# Patient Record
Sex: Male | Born: 1955 | Race: White | Hispanic: No | Marital: Married | Smoking: Former smoker
Health system: Southern US, Community
[De-identification: ages and names within clinical notes are randomized; demographics above are authoritative.]

## PROBLEM LIST (undated history)

## (undated) DIAGNOSIS — J449 Chronic obstructive pulmonary disease, unspecified: Secondary | ICD-10-CM

## (undated) DIAGNOSIS — N200 Calculus of kidney: Secondary | ICD-10-CM

## (undated) DIAGNOSIS — G4733 Obstructive sleep apnea (adult) (pediatric): Secondary | ICD-10-CM

## (undated) DIAGNOSIS — I10 Essential (primary) hypertension: Secondary | ICD-10-CM

## (undated) DIAGNOSIS — G4730 Sleep apnea, unspecified: Secondary | ICD-10-CM

## (undated) DIAGNOSIS — R29898 Other symptoms and signs involving the musculoskeletal system: Secondary | ICD-10-CM

## (undated) DIAGNOSIS — G8929 Other chronic pain: Secondary | ICD-10-CM

## (undated) DIAGNOSIS — M545 Low back pain, unspecified: Secondary | ICD-10-CM

## (undated) DIAGNOSIS — Z9989 Dependence on other enabling machines and devices: Secondary | ICD-10-CM

## (undated) DIAGNOSIS — R296 Repeated falls: Secondary | ICD-10-CM

## (undated) HISTORY — PX: LUMBAR DISC SURGERY: SHX700

## (undated) HISTORY — PX: LITHOTRIPSY: SUR834

## (undated) HISTORY — DX: Sleep apnea, unspecified: G47.30

## (undated) HISTORY — PX: APPENDECTOMY: SHX54

## (undated) HISTORY — DX: Essential (primary) hypertension: I10

## (undated) HISTORY — PX: HERNIA REPAIR: SHX51

---

## 2012-04-15 HISTORY — PX: ABDOMINAL HERNIA REPAIR: SHX539

## 2012-04-15 HISTORY — PX: TRANSURETHRAL RESECTION OF PROSTATE: SHX73

## 2013-04-15 HISTORY — PX: COLONOSCOPY: SHX174

## 2013-05-05 ENCOUNTER — Ambulatory Visit: Payer: Self-pay | Admitting: Cardiovascular Disease

## 2013-06-25 ENCOUNTER — Ambulatory Visit: Payer: Self-pay | Admitting: Cardiovascular Disease

## 2015-02-09 DIAGNOSIS — J449 Chronic obstructive pulmonary disease, unspecified: Secondary | ICD-10-CM | POA: Insufficient documentation

## 2015-02-09 DIAGNOSIS — Z87442 Personal history of urinary calculi: Secondary | ICD-10-CM | POA: Insufficient documentation

## 2015-02-09 DIAGNOSIS — M545 Low back pain: Secondary | ICD-10-CM | POA: Insufficient documentation

## 2015-02-09 DIAGNOSIS — G8929 Other chronic pain: Secondary | ICD-10-CM | POA: Insufficient documentation

## 2015-02-10 ENCOUNTER — Encounter (HOSPITAL_COMMUNITY): Payer: Self-pay | Admitting: Emergency Medicine

## 2015-02-10 ENCOUNTER — Emergency Department (HOSPITAL_COMMUNITY)
Admission: EM | Admit: 2015-02-10 | Discharge: 2015-02-10 | Disposition: A | Discharge status: Home / Self Care | Payer: 59 | Attending: Emergency Medicine | Admitting: Emergency Medicine

## 2015-02-10 DIAGNOSIS — M549 Dorsalgia, unspecified: Secondary | ICD-10-CM

## 2015-02-10 DIAGNOSIS — G8929 Other chronic pain: Secondary | ICD-10-CM

## 2015-02-10 HISTORY — DX: Chronic obstructive pulmonary disease, unspecified: J44.9

## 2015-02-10 HISTORY — DX: Calculus of kidney: N20.0

## 2015-02-10 MED ORDER — METHOCARBAMOL 1000 MG/10ML IJ SOLN: 1000.0000 mg | Freq: Once | INTRAMUSCULAR | Status: DC

## 2015-02-10 MED ORDER — ORPHENADRINE CITRATE ER 100 MG PO TB12: 100.0000 mg | ORAL_TABLET | Freq: Two times a day (BID) | ORAL | Status: DC

## 2015-02-10 MED ORDER — KETOROLAC TROMETHAMINE 30 MG/ML IJ SOLN
30.0000 mg | Freq: Once | INTRAMUSCULAR | Status: AC
  Administered 2015-02-10: 30 mg via INTRAVENOUS
  Filled 2015-02-10: qty 1

## 2015-02-10 MED ORDER — METHOCARBAMOL 1000 MG/10ML IJ SOLN
1000.0000 mg | Freq: Once | INTRAMUSCULAR | Status: AC
  Administered 2015-02-10: 1000 mg via INTRAVENOUS
  Filled 2015-02-10: qty 10

## 2015-02-10 MED ORDER — ONDANSETRON HCL 4 MG/2ML IJ SOLN
4.0000 mg | Freq: Once | INTRAMUSCULAR | Status: AC
  Administered 2015-02-10: 4 mg via INTRAVENOUS
  Filled 2015-02-10: qty 2

## 2015-02-10 MED ORDER — HYDROMORPHONE HCL 1 MG/ML IJ SOLN
1.0000 mg | Freq: Once | INTRAMUSCULAR | Status: AC
  Administered 2015-02-10: 1 mg via INTRAVENOUS
  Filled 2015-02-10: qty 1

## 2015-02-10 NOTE — ED Provider Notes (Signed)
CSN: [id number]     Arrival date & time 02/09/15  2349 History   By signing my name below, I, Arlan Organshley Leger, attest that this documentation has been prepared under the direction and in the presence of Dione Boozeavid Rebekka Lobello, MD.  Electronically Signed: Arlan OrganAshley Leger, ED Scribe. 02/10/2015. 2:09 AM.   Chief Complaint  Patient presents with  . Back Pain   The history is provided by the patient. No language interpreter was used.    HPI Comments: Jerel ShepherdJames Hodder is a 59 y.o. male who presents to the Emergency Department complaining of constant, ongoing R lower back pain that radiates down the R lower extremity that has been chronic in nature for several months now. However, pain worsened this evening. No identified injury or trauma. Pain is made worse with certain movements. No alleviating factors at this time. Prescribed Oxycodone attempted at home without any improvement for discomfort. No recent fever, chills, nausea, vomiting, chest pain, or shortness of breath. Denies weakness, loss of bowel/bladder function or saddle anesthesia.  PCP: No primary care provider on file.     Past Medical History  Diagnosis Date  . COPD (chronic obstructive pulmonary disease) (HCC)   . Kidney stone    Past Surgical History  Procedure Laterality Date  . Appendectomy    . Transurethral resection of prostate     No family history on file. Social History  Substance Use Topics  . Smoking status: Former Games developermoker  . Smokeless tobacco: None  . Alcohol Use: No    Review of Systems  Constitutional: Negative for fever and chills.  Respiratory: Negative for cough and shortness of breath.   Cardiovascular: Negative for chest pain.  Gastrointestinal: Negative for nausea, vomiting, abdominal pain and diarrhea.  Genitourinary: Negative for difficulty urinating.  Musculoskeletal: Positive for back pain and arthralgias.  Skin: Negative for rash.  Neurological: Negative for headaches.  Psychiatric/Behavioral: Negative  for confusion.  All other systems reviewed and are negative.     Allergies  Review of patient's allergies indicates no known allergies.  Home Medications   Prior to Admission medications   Not on File   Triage Vitals: BP 129/69 mmHg  Pulse 88  Temp(Src) 98.8 F (37.1 C) (Oral)  Resp 18  SpO2 92%  Physical Exam  Constitutional: He is oriented to person, place, and time. He appears well-developed and well-nourished.  Moves with trepidation splinting his lower back   HENT:  Head: Normocephalic and atraumatic.  Eyes: EOM are normal.  Neck: Normal range of motion.  Cardiovascular: Normal rate, regular rhythm, normal heart sounds and intact distal pulses.   Pulmonary/Chest: Effort normal and breath sounds normal. No respiratory distress.  Abdominal: Soft. He exhibits no distension. There is no tenderness.  Musculoskeletal: Normal range of motion. He exhibits tenderness.  Mild tenderness to R posterior iliac area Straight leg raise positive on R at 45 degrees   Neurological: He is alert and oriented to person, place, and time.  Skin: Skin is warm and dry.  Psychiatric: He has a normal mood and affect. Judgment normal.  Nursing note and vitals reviewed.   ED Course  Procedures (including critical care time)  DIAGNOSTIC STUDIES: Oxygen Saturation is 92% on ra, adequate by my interpretation.    COORDINATION OF CARE: 2:02 AM-Discussed treatment plan with pt at bedside and pt agreed to plan.     MDM   Final diagnoses:  Exacerbation of chronic back pain    Exacerbation of chronic back pain. No evidence of neurologic injury.  He was given intravenous ketorolac, methocarbamol, hydromorphone and felt considerably better following this. He is already on NSAIDs and narcotics at home. He is given a prescription for orphenadrine to add to his regimen and he is to follow-up with his orthopedic surgeon.  I personally performed the services described in this documentation, which was  scribed in my presence. The recorded information has been reviewed and is accurate.      Dione Booze, MD 02/10/15 [phone]

## 2015-02-10 NOTE — ED Notes (Signed)
Dr. Glick at bedside at this time  

## 2015-02-10 NOTE — Discharge Instructions (Signed)
Continue taking her meloxicam and oxycodone-acetaminophen. Follow up with your orthopedic doctor. Return if pain is not being adequately controlled at home.  Back Pain, Adult Back pain is very common in adults.The cause of back pain is rarely dangerous and the pain often gets better over time.The cause of your back pain may not be known. Some common causes of back pain include:  Strain of the muscles or ligaments supporting the spine.  Wear and tear (degeneration) of the spinal disks.  Arthritis.  Direct injury to the back. For many people, back pain may return. Since back pain is rarely dangerous, most people can learn to manage this condition on their own. HOME CARE INSTRUCTIONS Watch your back pain for any changes. The following actions may help to lessen any discomfort you are feeling:  Remain active. It is stressful on your back to sit or stand in one place for long periods of time. Do not sit, drive, or stand in one place for more than 30 minutes at a time. Take short walks on even surfaces as soon as you are able.Try to increase the length of time you walk each day.  Exercise regularly as directed by your health care provider. Exercise helps your back heal faster. It also helps avoid future injury by keeping your muscles strong and flexible.  Do not stay in bed.Resting more than 1-2 days can delay your recovery.  Pay attention to your body when you bend and lift. The most comfortable positions are those that put less stress on your recovering back. Always use proper lifting techniques, including:  Bending your knees.  Keeping the load close to your body.  Avoiding twisting.  Find a comfortable position to sleep. Use a firm mattress and lie on your side with your knees slightly bent. If you lie on your back, put a pillow under your knees.  Avoid feeling anxious or stressed.Stress increases muscle tension and can worsen back pain.It is important to recognize when you are  anxious or stressed and learn ways to manage it, such as with exercise.  Take medicines only as directed by your health care provider. Over-the-counter medicines to reduce pain and inflammation are often the most helpful.Your health care provider may prescribe muscle relaxant drugs.These medicines help dull your pain so you can more quickly return to your normal activities and healthy exercise.  Apply ice to the injured area:  Put ice in a plastic bag.  Place a towel between your skin and the bag.  Leave the ice on for 20 minutes, 2-3 times a day for the first 2-3 days. After that, ice and heat may be alternated to reduce pain and spasms.  Maintain a healthy weight. Excess weight puts extra stress on your back and makes it difficult to maintain good posture. SEEK MEDICAL CARE IF:  You have pain that is not relieved with rest or medicine.  You have increasing pain going down into the legs or buttocks.  You have pain that does not improve in one week.  You have night pain.  You lose weight.  You have a fever or chills. SEEK IMMEDIATE MEDICAL CARE IF:   You develop new bowel or bladder control problems.  You have unusual weakness or numbness in your arms or legs.  You develop nausea or vomiting.  You develop abdominal pain.  You feel faint.   This information is not intended to replace advice given to you by your health care provider. Make sure you discuss any questions you  have with your health care provider.   Document Released: 04/01/2005 Document Revised: 04/22/2014 Document Reviewed: 08/03/2013 Elsevier Interactive Patient Education 2016 Elsevier Inc.  Orphenadrine tablets What is this medicine? ORPHENADRINE (or FEN a dreen) helps to relieve pain and stiffness in muscles and can treat muscle spasms. This medicine may be used for other purposes; ask your health care provider or pharmacist if you have questions. What should I tell my health care provider before I  take this medicine? They need to know if you have any of these conditions: -glaucoma -heart disease -kidney disease -myasthenia gravis -peptic ulcer disease -prostate disease -stomach problems -an unusual or allergic reaction to orphenadrine, other medicines, foods, lactose, dyes, or preservatives -pregnant or trying to get pregnant -breast-feeding How should I use this medicine? Take this medicine by mouth with a full glass of water. Follow the directions on the prescription label. Take your medicine at regular intervals. Do not take your medicine more often than directed. Do not take more than you are told to take. Talk to your pediatrician regarding the use of this medicine in children. Special care may be needed. Patients over 58 years old may have a stronger reaction and need a smaller dose. Overdosage: If you think you have taken too much of this medicine contact a poison control center or emergency room at once. NOTE: This medicine is only for you. Do not share this medicine with others. What if I miss a dose? If you miss a dose, take it as soon as you can. If it is almost time for your next dose, take only that dose. Do not take double or extra doses. What may interact with this medicine? -alcohol -antihistamines -barbiturates, like phenobarbital -benzodiazepines -cyclobenzaprine -medicines for pain -phenothiazines like chlorpromazine, mesoridazine, prochlorperazine, thioridazine This list may not describe all possible interactions. Give your health care provider a list of all the medicines, herbs, non-prescription drugs, or dietary supplements you use. Also tell them if you smoke, drink alcohol, or use illegal drugs. Some items may interact with your medicine. What should I watch for while using this medicine? Your mouth may get dry. Chewing sugarless gum or sucking hard candy, and drinking plenty of water may help. Contact your doctor if the problem does not go away or is  severe. This medicine may cause dry eyes and blurred vision. If you wear contact lenses you may feel some discomfort. Lubricating drops may help. See your eye doctor if the problem does not go away or is severe. You may get drowsy or dizzy. Do not drive, use machinery, or do anything that needs mental alertness until you know how this medicine affects you. Do not stand or sit up quickly, especially if you are an older patient. This reduces the risk of dizzy or fainting spells. Alcohol may interfere with the effect of this medicine. Avoid alcoholic drinks. What side effects may I notice from receiving this medicine? Side effects that you should report to your doctor or health care professional as soon as possible: -allergic reactions like skin rash, itching or hives, swelling of the face, lips, or tongue -changes in vision -difficulty breathing -fast heartbeat or palpitations -hallucinations -light headedness, fainting spells -vomiting Side effects that usually do not require medical attention (report to your doctor or health care professional if they continue or are bothersome): -dizziness -drowsiness -headache -nausea This list may not describe all possible side effects. Call your doctor for medical advice about side effects. You may report side effects to FDA  at 1-800-FDA-1088. Where should I keep my medicine? Keep out of the reach of children. This medicine may cause accidental overdose and death if it taken by other adults, children, or pets. Mix any unused medicine with a substance like cat litter or coffee grounds. Then throw the medicine away in a sealed container like a sealed bag or a coffee can with a lid. Do not use the medicine after the expiration date. Store at room temperature between 15 and 30 degrees C (59 and 86 degrees F). NOTE: This sheet is a summary. It may not cover all possible information. If you have questions about this medicine, talk to your doctor, pharmacist, or  health care provider.    2016, Elsevier/Gold Standard. (2013-05-28 15:35:08)

## 2015-02-10 NOTE — ED Notes (Signed)
Discharge instructions/prescription reviewed with patient/spouse. Understanding verbalized. Patient declined wheelchair at time of discharge.  

## 2015-02-10 NOTE — ED Notes (Signed)
Pt. reports chronic low back pain for 2 months worse today unrelieved by prescription Oxycodone , pt. is scheduled for lumbar back surgery on 03/08/2015 by Dr. Debbe BalesBrook . Denies injury / no urinary discomfort .

## 2015-02-15 ENCOUNTER — Inpatient Hospital Stay (HOSPITAL_COMMUNITY)
Admission: EM | Admit: 2015-02-15 | Discharge: 2015-02-18 | DRG: 520 | Disposition: A | Discharge status: Home Health | Payer: 59 | Attending: Orthopedic Surgery | Admitting: Orthopedic Surgery

## 2015-02-15 ENCOUNTER — Encounter (HOSPITAL_COMMUNITY): Payer: Self-pay | Admitting: *Deleted

## 2015-02-15 ENCOUNTER — Inpatient Hospital Stay (HOSPITAL_COMMUNITY): Payer: 59

## 2015-02-15 DIAGNOSIS — M4806 Spinal stenosis, lumbar region: Secondary | ICD-10-CM | POA: Diagnosis present

## 2015-02-15 DIAGNOSIS — Z419 Encounter for procedure for purposes other than remedying health state, unspecified: Secondary | ICD-10-CM

## 2015-02-15 DIAGNOSIS — J449 Chronic obstructive pulmonary disease, unspecified: Secondary | ICD-10-CM | POA: Diagnosis present

## 2015-02-15 DIAGNOSIS — M5136 Other intervertebral disc degeneration, lumbar region: Secondary | ICD-10-CM | POA: Diagnosis present

## 2015-02-15 DIAGNOSIS — G4733 Obstructive sleep apnea (adult) (pediatric): Secondary | ICD-10-CM | POA: Diagnosis present

## 2015-02-15 DIAGNOSIS — Z9079 Acquired absence of other genital organ(s): Secondary | ICD-10-CM

## 2015-02-15 DIAGNOSIS — Z87891 Personal history of nicotine dependence: Secondary | ICD-10-CM

## 2015-02-15 DIAGNOSIS — M5127 Other intervertebral disc displacement, lumbosacral region: Secondary | ICD-10-CM

## 2015-02-15 DIAGNOSIS — K5900 Constipation, unspecified: Secondary | ICD-10-CM | POA: Diagnosis present

## 2015-02-15 DIAGNOSIS — M5416 Radiculopathy, lumbar region: Secondary | ICD-10-CM | POA: Diagnosis present

## 2015-02-15 DIAGNOSIS — R296 Repeated falls: Secondary | ICD-10-CM | POA: Diagnosis present

## 2015-02-15 DIAGNOSIS — M549 Dorsalgia, unspecified: Secondary | ICD-10-CM | POA: Diagnosis present

## 2015-02-15 DIAGNOSIS — M5116 Intervertebral disc disorders with radiculopathy, lumbar region: Secondary | ICD-10-CM | POA: Diagnosis present

## 2015-02-15 HISTORY — DX: Obstructive sleep apnea (adult) (pediatric): G47.33

## 2015-02-15 HISTORY — DX: Dependence on other enabling machines and devices: Z99.89

## 2015-02-15 HISTORY — DX: Other chronic pain: G89.29

## 2015-02-15 HISTORY — DX: Low back pain: M54.5

## 2015-02-15 HISTORY — DX: Other symptoms and signs involving the musculoskeletal system: R29.898

## 2015-02-15 HISTORY — DX: Low back pain, unspecified: M54.50

## 2015-02-15 HISTORY — DX: Repeated falls: R29.6

## 2015-02-15 LAB — SURGICAL PCR SCREEN
MRSA, PCR: NEGATIVE
Staphylococcus aureus: POSITIVE — AB

## 2015-02-15 MED ORDER — ACETAMINOPHEN 325 MG PO TABS: 650.0000 mg | ORAL_TABLET | Freq: Four times a day (QID) | ORAL | Status: DC | PRN

## 2015-02-15 MED ORDER — DOCUSATE SODIUM 100 MG PO CAPS
100.0000 mg | ORAL_CAPSULE | Freq: Two times a day (BID) | ORAL | Status: DC
  Administered 2015-02-15: 100 mg via ORAL
  Filled 2015-02-15: qty 1

## 2015-02-15 MED ORDER — MOMETASONE FURO-FORMOTEROL FUM 200-5 MCG/ACT IN AERO
2.0000 | INHALATION_SPRAY | Freq: Two times a day (BID) | RESPIRATORY_TRACT | Status: DC
  Administered 2015-02-15 – 2015-02-16 (×2): 2 via RESPIRATORY_TRACT
  Filled 2015-02-15: qty 8.8

## 2015-02-15 MED ORDER — ORPHENADRINE CITRATE ER 100 MG PO TB12
100.0000 mg | ORAL_TABLET | Freq: Two times a day (BID) | ORAL | Status: DC
  Filled 2015-02-15 (×3): qty 1

## 2015-02-15 MED ORDER — ONDANSETRON HCL 4 MG PO TABS: 4.0000 mg | ORAL_TABLET | Freq: Four times a day (QID) | ORAL | Status: DC | PRN

## 2015-02-15 MED ORDER — ZOLPIDEM TARTRATE 5 MG PO TABS
10.0000 mg | ORAL_TABLET | Freq: Every evening | ORAL | Status: DC | PRN
  Administered 2015-02-16: 10 mg via ORAL
  Filled 2015-02-15: qty 2

## 2015-02-15 MED ORDER — HYDROMORPHONE HCL 1 MG/ML IJ SOLN
1.0000 mg | INTRAMUSCULAR | Status: DC | PRN
  Administered 2015-02-15 – 2015-02-16 (×5): 1 mg via INTRAVENOUS
  Filled 2015-02-15 (×5): qty 1

## 2015-02-15 MED ORDER — MORPHINE SULFATE (PF) 2 MG/ML IV SOLN
2.0000 mg | INTRAVENOUS | Status: DC | PRN
  Administered 2015-02-15: 2 mg via INTRAVENOUS
  Filled 2015-02-15: qty 1

## 2015-02-15 MED ORDER — OXYCODONE HCL 5 MG PO TABS
5.0000 mg | ORAL_TABLET | ORAL | Status: DC | PRN
  Administered 2015-02-15 – 2015-02-16 (×2): 5 mg via ORAL
  Filled 2015-02-15 (×2): qty 1

## 2015-02-15 MED ORDER — ONDANSETRON HCL 4 MG/2ML IJ SOLN: 4.0000 mg | Freq: Four times a day (QID) | INTRAMUSCULAR | Status: DC | PRN

## 2015-02-15 MED ORDER — POLYETHYLENE GLYCOL 3350 17 G PO PACK: 17.0000 g | PACK | Freq: Every day | ORAL | Status: DC | PRN

## 2015-02-15 MED ORDER — HYDROMORPHONE HCL 1 MG/ML IJ SOLN
1.0000 mg | INTRAMUSCULAR | Status: DC | PRN
  Administered 2015-02-15: 1 mg via INTRAVENOUS
  Filled 2015-02-15: qty 1

## 2015-02-15 MED ORDER — FLEET ENEMA 7-19 GM/118ML RE ENEM: 1.0000 | ENEMA | Freq: Once | RECTAL | Status: DC | PRN

## 2015-02-15 MED ORDER — LORAZEPAM 2 MG/ML IJ SOLN
INTRAMUSCULAR | Status: AC
  Filled 2015-02-15: qty 1

## 2015-02-15 MED ORDER — ALPRAZOLAM 0.5 MG PO TABS
1.0000 mg | ORAL_TABLET | Freq: Every evening | ORAL | Status: DC | PRN
  Administered 2015-02-15: 1 mg via ORAL
  Filled 2015-02-15: qty 2

## 2015-02-15 MED ORDER — ALUM & MAG HYDROXIDE-SIMETH 200-200-20 MG/5ML PO SUSP: 30.0000 mL | Freq: Four times a day (QID) | ORAL | Status: DC | PRN

## 2015-02-15 MED ORDER — ACETAMINOPHEN 650 MG RE SUPP: 650.0000 mg | Freq: Four times a day (QID) | RECTAL | Status: DC | PRN

## 2015-02-15 MED ORDER — INFLUENZA VAC SPLIT QUAD 0.5 ML IM SUSY: 0.5000 mL | PREFILLED_SYRINGE | INTRAMUSCULAR | Status: DC

## 2015-02-15 MED ORDER — LORAZEPAM 2 MG/ML IJ SOLN
1.0000 mg | Freq: Once | INTRAMUSCULAR | Status: AC
  Administered 2015-02-15: 1 mg via INTRAVENOUS

## 2015-02-15 NOTE — ED Provider Notes (Signed)
MSE was initiated and I personally evaluated the patient and placed orders (if any) at  12:29 PM on February 15, 2015.  Kristopher ShepherdJames Walsh is a 59 y.o. male with a history of a herniated disc and low back pain who presents to the emergency department after the direction of orthopedic surgeon Dr. Shon BatonBrooks for admission and MRI. Patient patient reports he's been having worsening low back pain for the past 2 months. He reports he has numbness and tingling from his bilateral feet to his knees for the past week. He reports he's been having several falls recently due to tripping over his feet. He reports he last fell yesterday and denies any head or loss of consciousness. He denies recent injury to his back. On exam the patient is afebrile nontoxic appearing. He has midline low back and bilateral low back tenderness to palpation. Back edema to, deformity, ecchymosis or erythema. Patient has weakness with right-sided dorsiflexion. Plantar flexion bilaterally is intact. He reports decreased sensation to his bilateral lower extremities. Anette Riedelarmen Mayo, PA-C from Dr. Shon BatonBrooks office is in the ED and placed MRI of lumbar spine and admission orders. Patient will be admitted to Dr. Shon BatonBrooks service and will obtain MRI of his lumbar spine.   Filed Vitals:   02/15/15 1126 02/15/15 1145 02/15/15 1215  BP: 135/85 141/76 115/77  Pulse: 88 85 79  Temp: 98.5 F (36.9 C)    TempSrc: Oral    Resp: 18    SpO2: 93% 94% 93%      Everlene FarrierWilliam Rohaan Durnil, PA-C 02/15/15 1232  Benjiman CoreNathan Pickering, MD 02/15/15 1636

## 2015-02-15 NOTE — ED Notes (Signed)
Pt remains monitored by blood pressure and pulse ox. pts family remains at bedside.  

## 2015-02-15 NOTE — Progress Notes (Signed)
Patient has his own home CPAP unit and states that he will place it on when he is ready to go to sleep for the night. RT filled with sterile water and will continue to monitor.

## 2015-02-15 NOTE — Consult Note (Addendum)
    Patient ID: Kristopher Walsh MRN: [id number] DOB/AGE: 1955/10/16 59 y.o.  Admit date: 02/15/2015  Admission Diagnoses:  Active Problems:   * No active hospital problems. *   HPI: 59 year old male patient present with acute on chronic LBP and increasing symptom of right leg pain and weakness.  The pt has been falling and is concerned about further injury.  He reports even with pain medication his pain is severe.  He reports deterioration to having to use a walker because his right leg will give out.    Past Medical History: Past Medical History  Diagnosis Date  . COPD (chronic obstructive pulmonary disease) (HCC)   . Kidney stone     Surgical History: Past Surgical History  Procedure Laterality Date  . Appendectomy    . Transurethral resection of prostate      Family History: No family history on file.  Social History: Social History   Social History  . Marital Status: Married    Spouse Name: N/A  . Number of Children: N/A  . Years of Education: N/A   Occupational History  . Not on file.   Social History Main Topics  . Smoking status: Former Games developermoker  . Smokeless tobacco: Not on file  . Alcohol Use: No  . Drug Use: No  . Sexual Activity: Not on file   Other Topics Concern  . Not on file   Social History Narrative    Allergies: Review of patient's allergies indicates no known allergies.  Medications: I have reviewed the patient's current medications.  Vital Signs: Patient Vitals for the past 24 hrs:  BP Temp Temp src Pulse Resp SpO2  02/15/15 1126 135/85 mmHg 98.5 F (36.9 C) Oral 88 18 93 %    Radiology: No results found.  Labs: No results for input(s): WBC, RBC, HCT, PLT in the last 72 hours. No results for input(s): NA, K, CL, CO2, BUN, CREATININE, GLUCOSE, CALCIUM in the last 72 hours. No results for input(s): LABPT, INR in the last 72 hours.  Review of Systems: ROS  Physical Exam: ABD soft Neurovascular intact Sensation intact  distally Intact pulses distally The pt is unable to doriflex on the RLE  Assessment and Plan: Admit pt Ordered STAT MRI to assess increasing leg pain and  Weakness Consider surgical intervention tomorrow after discussion with Dr. Shon BatonBrooks NPO after mid-night  [facility] [phone]  Patient with progressive left LE weakness, falling, and pain.  Planned on elective decompression later this month.  However, this is his second trip to ER for increased pain.   As a result of progressive pain and neuro deficits will admit and plan on decompression tomorrow.  Will address disc herniation and stenosis at L2/3 and L3/4.  Will eval L4/5 and possible extend decompression down to this level as well Risks reviewed: infection, bleeding, ongoing or worse pain, need for further surgery including fusion, death, stroke, paralysis, leak of spinal fluid, nerve damage, and loss of bowel/bladder control.

## 2015-02-15 NOTE — ED Notes (Signed)
Pt was sent here by dr brooks for back surgery. Pt reports numbness in bilateral legs. Pt has had multiple falls recently as well.

## 2015-02-16 ENCOUNTER — Inpatient Hospital Stay (HOSPITAL_COMMUNITY): Payer: 59 | Admitting: Anesthesiology

## 2015-02-16 ENCOUNTER — Encounter (HOSPITAL_COMMUNITY)
Admission: EM | Disposition: A | Discharge status: Home Health | Payer: Self-pay | Source: Home / Self Care | Attending: Orthopedic Surgery

## 2015-02-16 ENCOUNTER — Encounter (HOSPITAL_COMMUNITY): Payer: Self-pay | Admitting: Anesthesiology

## 2015-02-16 ENCOUNTER — Inpatient Hospital Stay (HOSPITAL_COMMUNITY): Payer: 59

## 2015-02-16 DIAGNOSIS — M549 Dorsalgia, unspecified: Secondary | ICD-10-CM | POA: Diagnosis present

## 2015-02-16 HISTORY — PX: LUMBAR LAMINECTOMY/DECOMPRESSION MICRODISCECTOMY: SHX5026

## 2015-02-16 SURGERY — LUMBAR LAMINECTOMY/DECOMPRESSION MICRODISCECTOMY
Anesthesia: General | Site: Spine Lumbar

## 2015-02-16 MED ORDER — ACETAMINOPHEN 10 MG/ML IV SOLN
INTRAVENOUS | Status: AC
  Filled 2015-02-16: qty 100

## 2015-02-16 MED ORDER — CEFAZOLIN SODIUM-DEXTROSE 2-3 GM-% IV SOLR
INTRAVENOUS | Status: DC | PRN
  Administered 2015-02-16: 2 g via INTRAVENOUS

## 2015-02-16 MED ORDER — VANCOMYCIN HCL 500 MG IV SOLR
INTRAVENOUS | Status: DC | PRN
  Administered 2015-02-16: 500 mg

## 2015-02-16 MED ORDER — DEXAMETHASONE SODIUM PHOSPHATE 10 MG/ML IJ SOLN
INTRAMUSCULAR | Status: DC | PRN
  Administered 2015-02-16: 10 mg via INTRAVENOUS

## 2015-02-16 MED ORDER — CEFAZOLIN SODIUM 1-5 GM-% IV SOLN
1.0000 g | Freq: Three times a day (TID) | INTRAVENOUS | Status: AC
Start: 2015-02-16 — End: 2015-02-17
  Administered 2015-02-16 – 2015-02-17 (×2): 1 g via INTRAVENOUS
  Filled 2015-02-16 (×2): qty 50

## 2015-02-16 MED ORDER — OXYCODONE HCL 5 MG PO TABS: 5.0000 mg | ORAL_TABLET | Freq: Once | ORAL | Status: DC | PRN

## 2015-02-16 MED ORDER — MIDAZOLAM HCL 2 MG/2ML IJ SOLN
INTRAMUSCULAR | Status: AC
Start: 2015-02-16 — End: 2015-02-16
  Filled 2015-02-16: qty 4

## 2015-02-16 MED ORDER — HYDRALAZINE HCL 20 MG/ML IJ SOLN
INTRAMUSCULAR | Status: DC | PRN
  Administered 2015-02-16 (×2): 2 mg via INTRAVENOUS

## 2015-02-16 MED ORDER — PROMETHAZINE HCL 25 MG/ML IJ SOLN
12.5000 mg | Freq: Four times a day (QID) | INTRAMUSCULAR | Status: DC | PRN
  Administered 2015-02-16: 12.5 mg via INTRAVENOUS

## 2015-02-16 MED ORDER — DEXAMETHASONE 4 MG PO TABS
4.0000 mg | ORAL_TABLET | Freq: Four times a day (QID) | ORAL | Status: AC
  Administered 2015-02-16 – 2015-02-17 (×3): 4 mg via ORAL
  Filled 2015-02-16 (×3): qty 1

## 2015-02-16 MED ORDER — MUPIROCIN 2 % EX OINT
1.0000 "application " | TOPICAL_OINTMENT | Freq: Two times a day (BID) | CUTANEOUS | Status: DC
  Administered 2015-02-16: 1 via NASAL
  Filled 2015-02-16: qty 22

## 2015-02-16 MED ORDER — BUPIVACAINE-EPINEPHRINE 0.25% -1:200000 IJ SOLN
INTRAMUSCULAR | Status: DC | PRN
  Administered 2015-02-16: 10 mL

## 2015-02-16 MED ORDER — HYDROMORPHONE HCL 1 MG/ML IJ SOLN
0.5000 mg | INTRAMUSCULAR | Status: DC | PRN
  Administered 2015-02-16 (×2): 0.5 mg via INTRAVENOUS

## 2015-02-16 MED ORDER — ONDANSETRON HCL 4 MG/2ML IJ SOLN
INTRAMUSCULAR | Status: AC
  Filled 2015-02-16: qty 2

## 2015-02-16 MED ORDER — GLYCOPYRROLATE 0.2 MG/ML IJ SOLN
INTRAMUSCULAR | Status: AC
  Filled 2015-02-16: qty 4

## 2015-02-16 MED ORDER — VANCOMYCIN HCL 500 MG IV SOLR
INTRAVENOUS | Status: AC
  Filled 2015-02-16: qty 500

## 2015-02-16 MED ORDER — NEOSTIGMINE METHYLSULFATE 10 MG/10ML IV SOLN
INTRAVENOUS | Status: DC | PRN
Start: 2015-02-16 — End: 2015-02-16
  Administered 2015-02-16 (×2): 2.5 mg via INTRAVENOUS

## 2015-02-16 MED ORDER — HYDROMORPHONE HCL 1 MG/ML IJ SOLN
0.2500 mg | INTRAMUSCULAR | Status: DC | PRN
  Administered 2015-02-16: 0.5 mg via INTRAVENOUS

## 2015-02-16 MED ORDER — DEXAMETHASONE SODIUM PHOSPHATE 10 MG/ML IJ SOLN
INTRAMUSCULAR | Status: AC
  Filled 2015-02-16: qty 1

## 2015-02-16 MED ORDER — LACTATED RINGERS IV SOLN
INTRAVENOUS | Status: DC
  Administered 2015-02-16 (×3): via INTRAVENOUS

## 2015-02-16 MED ORDER — HYDROMORPHONE HCL 1 MG/ML IJ SOLN
INTRAMUSCULAR | Status: DC | PRN
  Administered 2015-02-16: 0.5 mg via INTRAVENOUS
  Administered 2015-02-16 (×2): .25 mg via INTRAVENOUS

## 2015-02-16 MED ORDER — SODIUM CHLORIDE 0.9 % IJ SOLN
3.0000 mL | Freq: Two times a day (BID) | INTRAMUSCULAR | Status: DC
  Administered 2015-02-16 – 2015-02-18 (×3): 3 mL via INTRAVENOUS

## 2015-02-16 MED ORDER — FENTANYL CITRATE (PF) 100 MCG/2ML IJ SOLN
INTRAMUSCULAR | Status: DC | PRN
  Administered 2015-02-16 (×2): 100 ug via INTRAVENOUS
  Administered 2015-02-16 (×6): 50 ug via INTRAVENOUS

## 2015-02-16 MED ORDER — PROPOFOL 10 MG/ML IV BOLUS
INTRAVENOUS | Status: AC
  Filled 2015-02-16: qty 20

## 2015-02-16 MED ORDER — OXYCODONE HCL 5 MG/5ML PO SOLN: 5.0000 mg | Freq: Once | ORAL | Status: DC | PRN

## 2015-02-16 MED ORDER — ACETAMINOPHEN 10 MG/ML IV SOLN
INTRAVENOUS | Status: DC | PRN
  Administered 2015-02-16: 1000 mg via INTRAVENOUS

## 2015-02-16 MED ORDER — ONDANSETRON HCL 4 MG/2ML IJ SOLN
INTRAMUSCULAR | Status: DC | PRN
Start: 2015-02-16 — End: 2015-02-16
  Administered 2015-02-16: 4 mg via INTRAVENOUS

## 2015-02-16 MED ORDER — HYDROMORPHONE HCL 1 MG/ML IJ SOLN
INTRAMUSCULAR | Status: AC
  Filled 2015-02-16: qty 1

## 2015-02-16 MED ORDER — METHOCARBAMOL 1000 MG/10ML IJ SOLN
500.0000 mg | Freq: Four times a day (QID) | INTRAVENOUS | Status: DC | PRN
  Filled 2015-02-16: qty 5

## 2015-02-16 MED ORDER — HYDRALAZINE HCL 20 MG/ML IJ SOLN
INTRAMUSCULAR | Status: AC
  Filled 2015-02-16: qty 1

## 2015-02-16 MED ORDER — LACTATED RINGERS IV SOLN
INTRAVENOUS | Status: DC
  Administered 2015-02-17: 04:00:00 via INTRAVENOUS

## 2015-02-16 MED ORDER — FENTANYL CITRATE (PF) 250 MCG/5ML IJ SOLN
INTRAMUSCULAR | Status: AC
  Filled 2015-02-16: qty 5

## 2015-02-16 MED ORDER — DEXTROSE 5 % IV SOLN
10.0000 mg | INTRAVENOUS | Status: DC | PRN
  Administered 2015-02-16: 40 ug/min via INTRAVENOUS

## 2015-02-16 MED ORDER — ONDANSETRON HCL 4 MG/2ML IJ SOLN: 4.0000 mg | INTRAMUSCULAR | Status: DC | PRN

## 2015-02-16 MED ORDER — GLYCOPYRROLATE 0.2 MG/ML IJ SOLN
INTRAMUSCULAR | Status: DC | PRN
  Administered 2015-02-16 (×2): 0.4 mg via INTRAVENOUS

## 2015-02-16 MED ORDER — ARTIFICIAL TEARS OP OINT
TOPICAL_OINTMENT | OPHTHALMIC | Status: AC
  Filled 2015-02-16: qty 3.5

## 2015-02-16 MED ORDER — MAGNESIUM CITRATE PO SOLN
0.5000 | Freq: Once | ORAL | Status: AC
  Administered 2015-02-17: 0.5 via ORAL
  Filled 2015-02-16: qty 296

## 2015-02-16 MED ORDER — MIDAZOLAM HCL 5 MG/5ML IJ SOLN
INTRAMUSCULAR | Status: DC | PRN
  Administered 2015-02-16 (×2): 1 mg via INTRAVENOUS

## 2015-02-16 MED ORDER — METHOCARBAMOL 500 MG PO TABS
ORAL_TABLET | ORAL | Status: AC
  Filled 2015-02-16: qty 1

## 2015-02-16 MED ORDER — HYDROMORPHONE HCL 1 MG/ML IJ SOLN
0.5000 mg | INTRAMUSCULAR | Status: DC | PRN
  Administered 2015-02-16: 1 mg via INTRAVENOUS
  Filled 2015-02-16: qty 1

## 2015-02-16 MED ORDER — SUCCINYLCHOLINE CHLORIDE 20 MG/ML IJ SOLN
INTRAMUSCULAR | Status: AC
  Filled 2015-02-16: qty 1

## 2015-02-16 MED ORDER — ACETAMINOPHEN 325 MG PO TABS: 325.0000 mg | ORAL_TABLET | ORAL | Status: DC | PRN

## 2015-02-16 MED ORDER — LIDOCAINE HCL (CARDIAC) 20 MG/ML IV SOLN
INTRAVENOUS | Status: AC
  Filled 2015-02-16: qty 5

## 2015-02-16 MED ORDER — PROMETHAZINE HCL 25 MG/ML IJ SOLN
INTRAMUSCULAR | Status: AC
  Filled 2015-02-16: qty 1

## 2015-02-16 MED ORDER — OXYCODONE HCL 5 MG PO TABS
ORAL_TABLET | ORAL | Status: AC
  Filled 2015-02-16: qty 2

## 2015-02-16 MED ORDER — 0.9 % SODIUM CHLORIDE (POUR BTL) OPTIME
TOPICAL | Status: DC | PRN
  Administered 2015-02-16: 1000 mL

## 2015-02-16 MED ORDER — HEMOSTATIC AGENTS (NO CHARGE) OPTIME
TOPICAL | Status: DC | PRN
  Administered 2015-02-16 (×2): 1 via TOPICAL

## 2015-02-16 MED ORDER — THROMBIN 20000 UNITS EX SOLR
CUTANEOUS | Status: DC | PRN
  Administered 2015-02-16: 20000 [IU] via TOPICAL

## 2015-02-16 MED ORDER — ROCURONIUM BROMIDE 50 MG/5ML IV SOLN
INTRAVENOUS | Status: AC
  Filled 2015-02-16: qty 2

## 2015-02-16 MED ORDER — ROCURONIUM BROMIDE 100 MG/10ML IV SOLN
INTRAVENOUS | Status: DC | PRN
  Administered 2015-02-16: 10 mg via INTRAVENOUS
  Administered 2015-02-16: 40 mg via INTRAVENOUS
  Administered 2015-02-16: 20 mg via INTRAVENOUS
  Administered 2015-02-16 (×2): 10 mg via INTRAVENOUS

## 2015-02-16 MED ORDER — SUCCINYLCHOLINE CHLORIDE 20 MG/ML IJ SOLN
INTRAMUSCULAR | Status: DC | PRN
  Administered 2015-02-16: 60 mg via INTRAVENOUS

## 2015-02-16 MED ORDER — FLEET ENEMA 7-19 GM/118ML RE ENEM
1.0000 | ENEMA | Freq: Once | RECTAL | Status: AC
  Administered 2015-02-17: 1 via RECTAL
  Filled 2015-02-16: qty 1

## 2015-02-16 MED ORDER — PHENOL 1.4 % MT LIQD: 1.0000 | OROMUCOSAL | Status: DC | PRN

## 2015-02-16 MED ORDER — MENTHOL 3 MG MT LOZG: 1.0000 | LOZENGE | OROMUCOSAL | Status: DC | PRN

## 2015-02-16 MED ORDER — BUPIVACAINE-EPINEPHRINE (PF) 0.25% -1:200000 IJ SOLN
INTRAMUSCULAR | Status: AC
  Filled 2015-02-16: qty 30

## 2015-02-16 MED ORDER — SODIUM CHLORIDE 0.9 % IV SOLN: 250.0000 mL | INTRAVENOUS | Status: DC

## 2015-02-16 MED ORDER — CHLORHEXIDINE GLUCONATE CLOTH 2 % EX PADS
6.0000 | MEDICATED_PAD | Freq: Every day | CUTANEOUS | Status: DC
  Administered 2015-02-16: 6 via TOPICAL

## 2015-02-16 MED ORDER — ALBUMIN HUMAN 5 % IV SOLN
INTRAVENOUS | Status: DC | PRN
  Administered 2015-02-16: 15:00:00 via INTRAVENOUS

## 2015-02-16 MED ORDER — ACETAMINOPHEN 160 MG/5ML PO SOLN: 325.0000 mg | ORAL | Status: DC | PRN

## 2015-02-16 MED ORDER — SODIUM CHLORIDE 0.9 % IJ SOLN: 3.0000 mL | INTRAMUSCULAR | Status: DC | PRN

## 2015-02-16 MED ORDER — DEXAMETHASONE SODIUM PHOSPHATE 4 MG/ML IJ SOLN: 4.0000 mg | Freq: Four times a day (QID) | INTRAMUSCULAR | Status: AC

## 2015-02-16 MED ORDER — METHOCARBAMOL 500 MG PO TABS
500.0000 mg | ORAL_TABLET | Freq: Four times a day (QID) | ORAL | Status: DC | PRN
  Administered 2015-02-16 – 2015-02-17 (×3): 500 mg via ORAL
  Filled 2015-02-16 (×4): qty 1

## 2015-02-16 MED ORDER — OXYCODONE HCL 5 MG PO TABS
10.0000 mg | ORAL_TABLET | ORAL | Status: DC | PRN
  Administered 2015-02-16 – 2015-02-18 (×8): 10 mg via ORAL
  Filled 2015-02-16 (×7): qty 2

## 2015-02-16 MED ORDER — THROMBIN 20000 UNITS EX SOLR
CUTANEOUS | Status: AC
  Filled 2015-02-16: qty 20000

## 2015-02-16 MED ORDER — PROPOFOL 10 MG/ML IV BOLUS
INTRAVENOUS | Status: DC | PRN
  Administered 2015-02-16: 20 mg via INTRAVENOUS
  Administered 2015-02-16: 160 mg via INTRAVENOUS
  Administered 2015-02-16: 40 mg via INTRAVENOUS
  Administered 2015-02-16: 20 mg via INTRAVENOUS

## 2015-02-16 SURGICAL SUPPLY — 54 items
BNDG GAUZE ELAST 4 BULKY (GAUZE/BANDAGES/DRESSINGS) ×3 IMPLANT
CANISTER SUCTION 2500CC (MISCELLANEOUS) ×3 IMPLANT
CLOSURE STERI-STRIP 1/2X4 (GAUZE/BANDAGES/DRESSINGS) ×1
CLSR STERI-STRIP ANTIMIC 1/2X4 (GAUZE/BANDAGES/DRESSINGS) ×2 IMPLANT
COVER SURGICAL LIGHT HANDLE (MISCELLANEOUS) ×3 IMPLANT
DRAIN CHANNEL 15F RND FF W/TCR (WOUND CARE) ×3 IMPLANT
DRAPE SURG 17X23 STRL (DRAPES) ×9 IMPLANT
DRAPE U-SHAPE 47X51 STRL (DRAPES) ×3 IMPLANT
DRSG MEPILEX BORDER 4X8 (GAUZE/BANDAGES/DRESSINGS) ×3 IMPLANT
DURAPREP 26ML APPLICATOR (WOUND CARE) ×3 IMPLANT
ELECT BLADE 4.0 EZ CLEAN MEGAD (MISCELLANEOUS)
ELECT CAUTERY BLADE 6.4 (BLADE) ×3 IMPLANT
ELECT PENCIL ROCKER SW 15FT (MISCELLANEOUS) ×3 IMPLANT
ELECT REM PT RETURN 9FT ADLT (ELECTROSURGICAL) ×3
ELECTRODE BLDE 4.0 EZ CLN MEGD (MISCELLANEOUS) IMPLANT
ELECTRODE REM PT RTRN 9FT ADLT (ELECTROSURGICAL) ×1 IMPLANT
EVACUATOR SILICONE 100CC (DRAIN) ×3 IMPLANT
GAUZE SPONGE 4X4 12PLY STRL (GAUZE/BANDAGES/DRESSINGS) ×3 IMPLANT
GLOVE BIOGEL PI IND STRL 6.5 (GLOVE) ×1 IMPLANT
GLOVE BIOGEL PI IND STRL 8.5 (GLOVE) ×1 IMPLANT
GLOVE BIOGEL PI INDICATOR 6.5 (GLOVE) ×2
GLOVE BIOGEL PI INDICATOR 8.5 (GLOVE) ×2
GLOVE SS BIOGEL STRL SZ 8.5 (GLOVE) ×1 IMPLANT
GLOVE SUPERSENSE BIOGEL SZ 8.5 (GLOVE) ×2
GOWN STRL REUS W/ TWL XL LVL3 (GOWN DISPOSABLE) ×2 IMPLANT
GOWN STRL REUS W/TWL 2XL LVL3 (GOWN DISPOSABLE) ×3 IMPLANT
GOWN STRL REUS W/TWL XL LVL3 (GOWN DISPOSABLE) ×4
KIT BASIN OR (CUSTOM PROCEDURE TRAY) ×3 IMPLANT
KIT ROOM TURNOVER OR (KITS) ×3 IMPLANT
KIT STIMULAN RAPID CURE 5CC (Orthopedic Implant) ×3 IMPLANT
NEEDLE 22X1 1/2 (OR ONLY) (NEEDLE) ×3 IMPLANT
NEEDLE SPNL 18GX3.5 QUINCKE PK (NEEDLE) ×6 IMPLANT
NS IRRIG 1000ML POUR BTL (IV SOLUTION) ×3 IMPLANT
PACK LAMINECTOMY ORTHO (CUSTOM PROCEDURE TRAY) ×3 IMPLANT
PACK UNIVERSAL I (CUSTOM PROCEDURE TRAY) ×3 IMPLANT
PAD ARMBOARD 7.5X6 YLW CONV (MISCELLANEOUS) ×6 IMPLANT
PATTIES SURGICAL .5 X.5 (GAUZE/BANDAGES/DRESSINGS) ×3 IMPLANT
PATTIES SURGICAL .5 X1 (DISPOSABLE) ×9 IMPLANT
SPONGE LAP 4X18 X RAY DECT (DISPOSABLE) ×12 IMPLANT
SPONGE SURGIFOAM ABS GEL 100 (HEMOSTASIS) ×3 IMPLANT
SURGIFLO W/THROMBIN 8M KIT (HEMOSTASIS) ×6 IMPLANT
SUT BONE WAX W31G (SUTURE) ×3 IMPLANT
SUT ETHILON 2 0 FS 18 (SUTURE) ×3 IMPLANT
SUT MON AB 3-0 SH 27 (SUTURE) ×2
SUT MON AB 3-0 SH27 (SUTURE) ×1 IMPLANT
SUT VIC AB 1 CTX 18 (SUTURE) ×6 IMPLANT
SUT VIC AB 2-0 CT1 18 (SUTURE) ×6 IMPLANT
SYR BULB IRRIGATION 50ML (SYRINGE) ×3 IMPLANT
SYR CONTROL 10ML LL (SYRINGE) ×3 IMPLANT
TAPE CLOTH SURG 4X10 WHT LF (GAUZE/BANDAGES/DRESSINGS) ×3 IMPLANT
TOWEL OR 17X24 6PK STRL BLUE (TOWEL DISPOSABLE) ×3 IMPLANT
TOWEL OR 17X26 10 PK STRL BLUE (TOWEL DISPOSABLE) ×3 IMPLANT
WATER STERILE IRR 1000ML POUR (IV SOLUTION) ×3 IMPLANT
YANKAUER SUCT BULB TIP NO VENT (SUCTIONS) ×3 IMPLANT

## 2015-02-16 NOTE — Progress Notes (Signed)
Helped pt set up home CPAP unit by removing from bag, plugging unit into a red outlet, and adding sterile water per pt's request. Pt will place on himself when ready. RT will continue to monitor.

## 2015-02-16 NOTE — Progress Notes (Signed)
Patients belongings including suitcase, CPAP machine, clothes in a belonging bag, jacket, and flowers all transported to room 5N22 by nursing student. Patient transferred there from PACU.

## 2015-02-16 NOTE — Brief Op Note (Signed)
02/15/2015 - 02/16/2015  4:27 PM  PATIENT:  Jerel ShepherdJames Angelica  59 y.o. male  PRE-OPERATIVE DIAGNOSIS:  Disc Herniation And Spinal Stenosis  POST-OPERATIVE DIAGNOSIS:  Disc Herniation And Spinal Stenosis  PROCEDURE:  Procedure(s): LUMBAR LAMINECTOMY/DECOMPRESSION MICRODISCECTOMY/L2-5 (N/A)  SURGEON:  Surgeon(s) and Role:    Venita Lick* Loria Lacina, MD - Primary  PHYSICIAN ASSISTANT:   ASSISTANTS: none   ANESTHESIA:   general  EBL:  Total I/O In: 2250 [I.V.:2000; IV Piggyback:250] Out: 745 [Urine:245; Blood:500]  BLOOD ADMINISTERED:none  DRAINS: (1) Jackson-Pratt drain(s) with closed bulb suction in the back   LOCAL MEDICATIONS USED:  MARCAINE     SPECIMEN:  No Specimen  DISPOSITION OF SPECIMEN:  N/A  COUNTS:  YES  TOURNIQUET:  * No tourniquets in log *  DICTATION: .Other Dictation: Dictation Number [id number]  PLAN OF CARE: Admit to inpatient   PATIENT DISPOSITION:  PACU - hemodynamically stable.

## 2015-02-16 NOTE — Anesthesia Procedure Notes (Signed)
Procedure Name: Intubation Date/Time: 02/16/2015 12:36 PM Performed by: Darcey NoraJAMES, Taryn Shellhammer B Pre-anesthesia Checklist: Patient identified, Emergency Drugs available, Suction available and Patient being monitored Patient Re-evaluated:Patient Re-evaluated prior to inductionOxygen Delivery Method: Circle system utilized Preoxygenation: Pre-oxygenation with 100% oxygen Intubation Type: IV induction Ventilation: Mask ventilation without difficulty Laryngoscope Size: Mac, 4 and Glidescope Grade View: Grade II Tube type: Oral Tube size: 7.5 mm Number of attempts: 1 Airway Equipment and Method: Stylet and Video-laryngoscopy Placement Confirmation: ETT inserted through vocal cords under direct vision,  breath sounds checked- equal and bilateral and positive ETCO2 Secured at: 22 (cm at teeth) cm Tube secured with: Tape Dental Injury: Teeth and Oropharynx as per pre-operative assessment

## 2015-02-16 NOTE — Anesthesia Preprocedure Evaluation (Addendum)
Anesthesia Evaluation  Patient identified by MRN, date of birth, ID band Patient awake    Reviewed: Allergy & Precautions, NPO status , Patient's Chart, lab work & pertinent test results, reviewed documented beta blocker date and time   History of Anesthesia Complications Negative for: history of anesthetic complications  Airway Mallampati: II  TM Distance: >3 FB Neck ROM: Full    Dental  (+) Teeth Intact, Caps, Dental Advisory Given   Pulmonary sleep apnea and Continuous Positive Airway Pressure Ventilation , COPD, former smoker,    breath sounds clear to auscultation       Cardiovascular negative cardio ROS   Rhythm:Regular     Neuro/Psych  Neuromuscular disease    GI/Hepatic negative GI ROS, Neg liver ROS,   Endo/Other  negative endocrine ROS  Renal/GU negative Renal ROS     Musculoskeletal   Abdominal   Peds  Hematology negative hematology ROS (+)   Anesthesia Other Findings Multi unit bridge upper front  Reproductive/Obstetrics                           Anesthesia Physical Anesthesia Plan  ASA: II  Anesthesia Plan: General   Post-op Pain Management:    Induction:   Airway Management Planned: Oral ETT  Additional Equipment: None  Intra-op Plan:   Post-operative Plan: Extubation in OR  Informed Consent: I have reviewed the patients History and Physical, chart, labs and discussed the procedure including the risks, benefits and alternatives for the proposed anesthesia with the patient or authorized representative who has indicated his/her understanding and acceptance.   Dental advisory given  Plan Discussed with: CRNA and Surgeon  Anesthesia Plan Comments:        Anesthesia Quick Evaluation

## 2015-02-16 NOTE — Transfer of Care (Signed)
Immediate Anesthesia Transfer of Care Note  Patient: Kristopher ShepherdJames Eyster  Procedure(s) Performed: Procedure(s): LUMBAR LAMINECTOMY/DECOMPRESSION MICRODISCECTOMY/L2-5 (N/A)  Patient Location: PACU  Anesthesia Type:General  Level of Consciousness: awake, alert  and patient cooperative  Airway & Oxygen Therapy: Patient Spontanous Breathing and Patient connected to face mask oxygen  Post-op Assessment: Report given to RN, Post -op Vital signs reviewed and stable and Patient moving all extremities  Post vital signs: Reviewed and stable  Last Vitals:  Filed Vitals:   02/16/15 0822  BP: 155/90  Pulse: 85  Temp:   Resp: 16    Complications: No apparent anesthesia complications

## 2015-02-17 ENCOUNTER — Encounter (HOSPITAL_COMMUNITY): Payer: Self-pay | Admitting: Orthopedic Surgery

## 2015-02-17 LAB — POCT I-STAT 4, (NA,K, GLUC, HGB,HCT)
Glucose, Bld: 140 mg/dL — ABNORMAL HIGH (ref 65–99)
HEMATOCRIT: 39 % (ref 39.0–52.0)
HEMOGLOBIN: 13.3 g/dL (ref 13.0–17.0)
Potassium: 5 mmol/L (ref 3.5–5.1)
Sodium: 134 mmol/L — ABNORMAL LOW (ref 135–145)

## 2015-02-17 MED ORDER — ALPRAZOLAM 0.5 MG PO TABS
1.5000 mg | ORAL_TABLET | Freq: Every evening | ORAL | Status: DC | PRN
  Administered 2015-02-17 – 2015-02-18 (×2): 1.5 mg via ORAL
  Filled 2015-02-17 (×2): qty 3

## 2015-02-17 MED ORDER — ZOLPIDEM TARTRATE 5 MG PO TABS: 10.0000 mg | ORAL_TABLET | Freq: Every evening | ORAL | Status: DC | PRN

## 2015-02-17 MED ORDER — OXYCODONE-ACETAMINOPHEN 10-325 MG PO TABS: 1.0000 | ORAL_TABLET | ORAL | Status: DC | PRN

## 2015-02-17 MED ORDER — POLYETHYLENE GLYCOL 3350 17 GM/SCOOP PO POWD: 17.0000 g | Freq: Every day | ORAL | Status: DC

## 2015-02-17 MED ORDER — METHOCARBAMOL 500 MG PO TABS: 500.0000 mg | ORAL_TABLET | Freq: Three times a day (TID) | ORAL | Status: AC | PRN

## 2015-02-17 MED ORDER — ONDANSETRON HCL 4 MG PO TABS: 4.0000 mg | ORAL_TABLET | Freq: Three times a day (TID) | ORAL | Status: DC | PRN

## 2015-02-17 NOTE — Progress Notes (Signed)
Orthopedic Tech Progress Note Patient Details:  Kristopher ShepherdJames Walsh [birth year] [id number]  Patient ID: Kristopher Walsh, male   DOB: [birth year], 59 y.o.   MRN: [id number] Called in bio-tech brace order; spoke with Kristopher MaltaStephanie  Kristopher Walsh 02/17/2015, 9:14 AM

## 2015-02-17 NOTE — Op Note (Signed)
NAMESEWARD, Kristopher Walsh NO.:  [id number]  MEDICAL RECORD NO.:  [id number]  LOCATION:  5N22C                        FACILITY:  [facility]  PHYSICIAN:  Kristopher Walsh, M.D. DATE OF BIRTH:  08-20-1955  DATE OF PROCEDURE:  02/16/2015 DATE OF DISCHARGE:                              OPERATIVE REPORT   PREOPERATIVE DIAGNOSES:  Lumbar spinal stenosis, L2-3, L3-4, with disc herniations L2-3, L3-4.  POSTOPERATIVE DIAGNOSES:  Lumbar spinal stenosis, L2-3, L3-4, with disc herniations L2-3, L3-4.  OPERATIVE PROCEDURES:  Lumbar decompression at L2-3, L3-4, and partial L4-5 revision decompression.  COMPLICATIONS:  None.  INTRAOPERATIVE FINDINGS:  Large posterolateral bilateral disk herniation at L2-3 with marked large fragment central and slightly actually worse to the right, consistent with the patient's preoperative pain, severe spinal stenosis, L3-4.  HISTORY:  This is a very pleasant 59 year old gentleman who is having progressive weakness in his right lower extremity, falling, and horrific back pain.  The patient was actually seen by me and is scheduled for an elective procedure, but because of the progressive deficits, he was admitted to the hospital and we planned on moving forward with the surgery early.  DESCRIPTION OF PROCEDURE:  The patient was brought to the operating room, placed supine on the operating table.  After successful induction of general anesthesia and endotracheal intubation, TEDs, SCDs, and Foley were inserted.  He was turned prone onto the Wilson frame and all bony prominences were well padded.  The back was prepped and draped in a standard fashion.  Time-out was taken, confirming the patient, procedure, and all other pertinent important data.  Once this was done, the midline incision was infiltrated with 0.25% Marcaine and I made an incision starting at the superior aspect of L2 and proceeding down to the inferior aspect of L4.  The patient had  a previous left lumbar laminotomy at L4-5 in the past.  I re-incised this incision and extended it proximally.  Sharp dissection was carried out down to the deep fascia.  The deep fascia was sharply incised.  I then used a Cobb elevator and Bovie to strip the paraspinal muscles and expose the L2, L3, and L4 spinous processes, lamina, and facet complex.  Once this was done bilaterally, self-retaining retractors were placed.  A Penfield 4 was placed under the L2 lamina.  I confirmed that I was at the appropriate level.  I then removed the entire L3 and the majority of the L2 spinous processes using a double-action Leksell rongeur.  I also took down the superior portion of the remaining L4 spinous process.  I then used the 3.0 Karlin curette to develop a plane underneath the L3 lamina and used 2 and 3 mm Kerrison punches to perform a central laminotomy of L3.  I then dissected through the central raphe of the ligamentum flavum and removed this to expose the thecal sac at L3-4.  I then placed a Penfield 4 underneath the leading edge of the L4 lamina and debrided significant portion of this with a 3 mm Kerrison rongeur.  This allowed me into the lateral recess.  I then identified the L4 nerve root and the L4 pedicle and then decompressed the foramen of L4 to complete my L4-5  decompression, L4 foraminotomy.  Once this was done bilaterally, I then proceeded superiorly, completing my L3 laminectomy centrally and then performed a generous laminotomy of L2 using Kerrison punch.  With the central decompression complete, I then went into the lateral recess. There were very large epidural veins, all which were coagulated.  On the left-hand side, I identified the L2-3 disk space and performed an annulotomy.  I used an Epstein curette and nerve hooks to mobilize the disk fragment.  I did get a fragmented disk material to come out.  I then went to the right-hand side and using my nerve hook, I was able  to manipulate out of very large fragmented disk herniation, consistent with what was seen on the preoperative MRI.  There was marked reduction and tension on both the L3 nerve roots.  I then proceeded to the L3-4 disk space.  Per the MRI, he had mostly right-sided lateral recess stenosis. There was thickening of the ligamentum flavum and bone spurs, all of which were taken down with Kerrison punches.  I then identified the L3 pedicle and the L3 nerve and performed a generous foraminotomy at this level as well.  I incised the disk space and began using my Epstein curette to knock down the osteophyte.  There were some small fragments of disk, subannular disk herniation noted, and they were removed with a micropituitary rongeur.  At this point, I took last x-ray with a withstanding my whole decompression level.  The decompression span from the midportion of the L2 pedicle down to the L4 foramen.  Once this was completed, I irrigated copiously with normal saline and then checked the L4, L3, and L2 nerve roots bilaterally and they were under no due tension and they were completely decompressed.  I then was able to sweep circumferentially at each disk space level underneath the canal, confirming that the decompression was adequate.  At this point, due to his age after copiously irrigating with normal saline and obtaining hemostasis using bipolar electrocautery and FloSeal, I placed antibiotic impregnated beads to help reduce the risk of wound infection. I placed a thrombin-soaked Gelfoam patty over the entire exposed laminotomy site and then placed the beads.  I also placed a deep drain. I then closed the deep fascia with interrupted #1 Vicryl sutures and then superficial with 2-0 Vicryl sutures and a 3-0 Monocryl for the skin.  Steri-Strips and a dry dressing were applied.  The patient was extubated, transferred to the PACU without incident.  At the end of the case, all needle and sponge  counts were correct.  There were no adverse intraoperative events.     Kristopher Walsh, M.D.     DDB/MEDQ  D:  02/16/2015  T:  02/17/2015  Job:  [id number]  cc:   Debria Garretahari D. Shon Walsh, M.D.'s Office

## 2015-02-17 NOTE — Evaluation (Signed)
Occupational Therapy Evaluation Patient Details Name: Kristopher Walsh MRN: [id number] DOB: 03-Dec-1955 Today's Date: 02/17/2015    History of Present Illness 59 y.o. male s/p LUMBAR LAMINECTOMY/DECOMPRESSION MICRODISCECTOMY/L2-5. PMH: COPD     Clinical Impression   Pt reports he was independent with ADLs prior to about a week to a week and a half ago when he starting having significant back pain. This past week he has required assistance from his wife with LB ADLs. Began back and ADL education with pt and his wife; pt verbalized understanding. Pt able to recall 0/3 back precautions; educated on all precautions. Pt planning to d/c home with 24/7 supervision from his wife. Pt would benefit from continued skilled OT in order to maximize independence and safety with LB ADLs and functional mobility prior to returning home.     Follow Up Recommendations  No OT follow up;Supervision - Intermittent    Equipment Recommendations  3 in 1 bedside comode    Recommendations for Other Services       Precautions / Restrictions Precautions Precautions: Back Precaution Comments: Pt able to recall 0/3 back precautions. Reviewed all precautions Required Braces or Orthoses: Spinal Brace Spinal Brace: Applied in sitting position Restrictions Weight Bearing Restrictions: No      Mobility Bed Mobility Overal bed mobility: Needs Assistance Bed Mobility: Rolling;Sidelying to Sit Rolling: Supervision (rolling to rt, using rail) Sidelying to sit: Min assist (with verbal cues)       General bed mobility comments: Pt in recliner, returned to recliner at end of session.  Transfers Overall transfer level: Needs assistance Equipment used: Rolling walker (2 wheeled) Transfers: Sit to/from Stand Sit to Stand: Min assist;Min guard         General transfer comment: Min A to boost up from chair. Min guard assist for sit [facility]    Balance Overall balance assessment: Needs  assistance Sitting-balance support: Feet supported Sitting balance-Leahy Scale: Good     Standing balance support: During functional activity Standing balance-Leahy Scale: Poor Standing balance comment: Able to wash hands standing at sink with no UE support                            ADL Overall ADL's : Needs assistance/impaired Eating/Feeding: Set up;Sitting   Grooming: Wash/dry hands;Min guard;Standing       Lower Body Bathing: Minimal assistance;Sit to/from stand       Lower Body Dressing: Minimal assistance;Sit to/from stand   Toilet Transfer: Minimal assistance;Ambulation;BSC;RW (BSC over toilet) Toilet Transfer Details (indicate cue type and reason): Min A to boost up from chair. Min guard for transfer. Min guard for sit to stand from toilet         Functional mobility during ADLs: Min guard;Rolling walker General ADL Comments: Wife present for OT eval. Began ADL and back precaution education with pt and wife. Educated on placing frequently used items at counter top height, need for close supervision during ADLs and functional mobility, precautions during functional activities; pt and wife verbalized understanding. Discussed use of AE for increased independence with LB ADLs; pt declined stating that wife could help as needed. Educated on not sitting up in chair for more than 45 minutes to an hour without getting up and moving around; pt and wife verbalize understanding.     Vision     Perception     Praxis      Pertinent Vitals/Pain Pain Assessment: Faces Pain Score: 5  Faces Pain  Scale: Hurts little more Pain Location: back Pain Descriptors / Indicators: Sore Pain Intervention(s): Limited activity within patient's tolerance;Monitored during session     Hand Dominance     Extremity/Trunk Assessment Upper Extremity Assessment Upper Extremity Assessment: Generalized weakness   Lower Extremity Assessment Lower Extremity Assessment: Defer to PT  evaluation   Cervical / Trunk Assessment Cervical / Trunk Assessment: Normal   Communication Communication Communication: No difficulties   Cognition Arousal/Alertness: Awake/alert Behavior During Therapy: WFL for tasks assessed/performed Overall Cognitive Status: Within Functional Limits for tasks assessed       Memory: Decreased recall of precautions             General Comments       Exercises       Shoulder Instructions      Home Living Family/patient expects to be discharged to:: Private residence Living Arrangements: Spouse/significant other Available Help at Discharge: Family;Available 24 hours/day Type of Home: House Home Access: Stairs to enter [employer] of Steps: 2-3 Entrance Stairs-Rails: None Home Layout: Two level;Able to live on main level with bedroom/bathroom Alternate Level Stairs-Number of Steps: 3 Alternate Level Stairs-Rails: None Bathroom Shower/Tub: Producer, television/film/video: Handicapped height Bathroom Accessibility: Yes How Accessible: Accessible via walker Home Equipment: Walker - 2 wheels          Prior Functioning/Environment Level of Independence: Independent with assistive device(s);Needs assistance    ADL's / Homemaking Assistance Needed: Pt reports he required assist from his wife for LB ADLs over the past week to week and a half due to pain   Comments: using rw prior to surgery.     OT Diagnosis: Generalized weakness;Acute pain   OT Problem List: Decreased strength;Impaired balance (sitting and/or standing);Decreased knowledge of use of DME or AE;Decreased knowledge of precautions;Pain   OT Treatment/Interventions: Self-care/ADL training;DME and/or AE instruction;Patient/family education    OT Goals(Current goals can be found in the care plan section) Acute Rehab OT Goals Patient Stated Goal: go home tomorrow OT Goal Formulation: With patient/family Time For Goal Achievement: 03/03/15 Potential to  Achieve Goals: Good ADL Goals Pt Will Perform Lower Body Bathing: with min assist;sit to/from stand;with caregiver independent in assisting Pt Will Perform Lower Body Dressing: with min assist;with caregiver independent in assisting;sit to/from stand Pt Will Transfer to Toilet: with supervision;ambulating;bedside commode (BSC over toilet) Pt Will Perform Toileting - Clothing Manipulation and hygiene: with supervision;sit to/from stand (with or without AE) Pt Will Perform Tub/Shower Transfer: Shower transfer;with supervision;ambulating;3 in 1;rolling walker  OT Frequency: Min 2X/week   Barriers to D/C:            Co-evaluation              End of Session Equipment Utilized During Treatment: Gait belt;Rolling walker;Back brace  Activity Tolerance: Patient tolerated treatment well Patient left: in chair;with call bell/phone within reach;with family/visitor present   Time: 2725-3664 OT Time Calculation (min): 25 min Charges:  OT General Charges $OT Visit: 1 Procedure OT Evaluation $Initial OT Evaluation Tier I: 1 Procedure OT Treatments $Self Care/Home Management : 8-22 mins G-Codes:     Gaye Alken M.S., OTR/L Pager: [phone]  02/17/2015, 3:37 PM

## 2015-02-17 NOTE — Anesthesia Postprocedure Evaluation (Signed)
  Anesthesia Post-op Note  Patient: Kristopher ShepherdJames Walsh  Procedure(s) Performed: Procedure(s): LUMBAR LAMINECTOMY/DECOMPRESSION MICRODISCECTOMY/L2-5 (N/A)  Patient Location: PACU  Anesthesia Type:General  Level of Consciousness: awake  Airway and Oxygen Therapy: Patient Spontanous Breathing  Post-op Pain: moderate  Post-op Assessment: Post-op Vital signs reviewed, Patient's Cardiovascular Status Stable, Respiratory Function Stable, Patent Airway, No signs of Nausea or vomiting and Pain level controlled   LLE Sensation: No tingling   RLE Sensation: Tingling      Post-op Vital Signs: Reviewed and stable  Last Vitals:  Filed Vitals:   02/17/15 0628  BP: 142/81  Pulse:   Temp:   Resp:     Complications: No apparent anesthesia complications

## 2015-02-17 NOTE — Care Management Note (Signed)
Case Management Note  Patient Details  Name: Kristopher Walsh MRN: [id number] Date of Birth: 25-Apr-1955  Subjective/Objective:  59 yr old male s/p L2-5 Laminectomy decompression.                  Action/Plan: Case manager spoke with patient and wife concerning home health needs. They state they have a walker at home. Choice was offered. [facility]. Patient will have family support at discharge.   Expected Discharge Date:    02/18/15              Expected Discharge Plan:   Home with Home Health  In-House Referral:  NA  Discharge planning Services  CM Consult  Post Acute Care Choice:  Home Health Choice offered to:  Spouse, Patient  DME Arranged:  N/A DME Agency:  NA  HH Arranged:  PT HH Agency:  [facility]  Status of Service:  Completed, signed off  Medicare Important Message Given:    Date Medicare IM Given:    Medicare IM give by:    Date Additional Medicare IM Given:    Additional Medicare Important Message give by:     If discussed at Long Length of Stay Meetings, dates discussed:    Additional Comments:  Durenda GuthrieBrady, Zayven Powe Naomi, RN 02/17/2015, 3:40 PM

## 2015-02-17 NOTE — Progress Notes (Signed)
    Subjective: Procedure(s) (LRB): LUMBAR LAMINECTOMY/DECOMPRESSION MICRODISCECTOMY/L2-5 (N/A) 1 Day Post-Op  Patient reports pain as 4 on 0-10 scale.  Reports decreased leg pain reports incisional back pain   N/A void - foley removed 1/2 hrs ago Negative bowel movement Positive flatus Negative chest pain or shortness of breath  Objective: Vital signs in last 24 hours: Temp:  [98 F (36.7 C)-99 F (37.2 C)] 98.4 F (36.9 C) (11/04 0627) Pulse Rate:  [85-117] 98 (11/04 0627) Resp:  [12-20] 16 (11/04 0627) BP: (118-158)/(81-101) 142/81 mmHg (11/04 0628) SpO2:  [96 %-99 %] 97 % (11/04 0627)  Intake/Output from previous day: 11/03 0701 - 11/04 0700 In: 4277.3 [P.O.:1030; I.V.:2897.3; IV Piggyback:350] Out: 3955 [Urine:3320; Drains:135; Blood:500]  Labs:  Recent Labs  02/16/15 1548  HCT 39.0    Recent Labs  02/16/15 1548  NA 134*  K 5.0  GLUCOSE 140*   No results for input(s): LABPT, INR in the last 72 hours.  Physical Exam: ABD soft Intact pulses distally Dorsiflexion/Plantar flexion intact Incision: dressing C/D/I Compartment soft hip/knee flexor strength improved from pre-op   Drain total 135  Assessment/Plan: Patient stable  xrays n/a Continue mobilization with physical therapy Continue care  Advance diet Up with therapy  Will set up HHS Continue drain today - will d/c in AM Fleets/Mag citrate for constipation Change to oral pain meds Possible d/c over the weekend  Venita Lickahari Chyanne Kohut, MD [facility] [phone]

## 2015-02-17 NOTE — Progress Notes (Signed)
Nutrition Brief Note  Patient identified on the Malnutrition Screening Tool (MST) Report  Wt Readings from Last 15 Encounters:  No data found for Wt  Pt reports weight has been stable with usual body weight of ~235 lbs.   Current diet order is regular. No meal completion recorded at this time, however pt reports appetite is fine currently and PTA with no other difficulties. Labs and medications reviewed.    No nutrition interventions warranted at this time. If nutrition issues arise, please consult RD.   Roslyn SmilingStephanie Traylon Schimming, MS, RD, LDN Pager # [phone] After hours/ weekend pager # [phone]

## 2015-02-17 NOTE — Progress Notes (Signed)
Physical Therapy Treatment Patient Details Name: Kristopher ShepherdJames Yoon MRN: [id number] DOB: 1956/03/06 Today's Date: 02/17/2015    History of Present Illness 59 y.o. male s/p LUMBAR LAMINECTOMY/DECOMPRESSION MICRODISCECTOMY/L2-5. PMH: COPD      PT Comments    Patient is making gradual progress with PT from a mobility standpoint. Patient able to ambulate 140 feet with min guard assistance, no loss of balance. Needing min assist and cues for logroll. Patient expressing that he is hoping to DC home tomorrow. Will need to attempt stairs before D/C. Will continue to follow and progress mobility as tolerated.      Follow Up Recommendations  Home health PT;Supervision for mobility/OOB     Equipment Recommendations  None recommended by PT    Recommendations for Other Services       Precautions / Restrictions  Precautions Precautions: Back Required Braces or Orthoses: Spinal Brace Spinal Brace: Applied in sitting position Restrictions Weight Bearing Restrictions: No   Mobility  Bed Mobility Overal bed mobility: Needs Assistance Bed Mobility: Sit to Sidelying;Rolling Rolling: Supervision (cues for shoulder/hips/knees together.)      Sit to sidelying: Min assist (with LEs) General bed mobility comments: Bed elevated to approximate height of bed at home. Cues verbal and manual for logroll technique.  Transfers Overall transfer level: Needs assistance Equipment used: Rolling walker (2 wheeled) Transfers: Sit to/from Stand Sit to Stand: Min guard         General transfer comment: from recliner, reminder for hand position.  Ambulation/Gait Ambulation/Gait assistance: Min guard Ambulation Distance (Feet): 140 Feet Assistive device: Rolling walker (2 wheeled) Gait Pattern/deviations: Step-through pattern Gait velocity: decreased.    General Gait Details: even strides, no loss of balance, cues for posture as needed.    Stairs            Wheelchair Mobility     Modified Rankin (Stroke Patients Only)       Balance Overall balance assessment: Needs assistance Sitting-balance support: No upper extremity supported Sitting balance-Leahy Scale: Good     Standing balance support: Bilateral upper extremity supported Standing balance-Leahy Scale: Poor Standing balance comment: using rw                    Cognition Arousal/Alertness: Awake/alert Behavior During Therapy: WFL for tasks assessed/performed Overall Cognitive Status: Within Functional Limits for tasks assessed       Memory: Decreased recall of precautions              Exercises      General Comments       Pertinent Vitals/Pain Pain Assessment: 0-10 Pain Score: 5  Faces Pain Scale: Hurts little more Pain Location: back Pain Descriptors / Indicators: Sore Pain Intervention(s): Monitored during session;Limited activity within patient's tolerance    Home Living       Prior Function      PT Goals (current goals can now be found in the care plan section) Acute Rehab PT Goals Patient Stated Goal: go home tomorrow PT Goal Formulation: With patient Time For Goal Achievement: 03/03/15 Potential to Achieve Goals: Good Progress towards PT goals: Progressing toward goals    Frequency  Min 5X/week    PT Plan Current plan remains appropriate    Co-evaluation             End of Session Equipment Utilized During Treatment: Gait belt;Back brace Activity Tolerance: Patient tolerated treatment well Patient left: with call bell/phone within reach;with family/visitor present;in bed;with SCD's reapplied     Time: [id number]  PT Time Calculation (min) (ACUTE ONLY): 16 min  Charges:  $Gait Training: 8-22 mins                     G Codes:      Christiane Ha, PT, CSCS Pager [phone] Office [phone]  02/17/2015, 4:08 PM

## 2015-02-17 NOTE — Evaluation (Signed)
Physical Therapy Evaluation Patient Details Name: Kristopher ShepherdJames Walsh MRN: [id number] DOB: 25-Dec-1955 Today's Date: 02/17/2015   History of Present Illness  59 y.o. male s/p LUMBAR LAMINECTOMY/DECOMPRESSION MICRODISCECTOMY/L2-5. PMH: COPD    Clinical Impression  Patient is s/p above surgery resulting in the deficits listed below (see PT Problem List). Patient will benefit from skilled PT to increase their independence and safety with mobility (while adhering to their precautions) to allow discharge to home with family support. Extensive time spent during session reviewing bed mobility and back precautions. Addition time spent on ambulation both in room and in halls. Will continue to progress mobility as tolerated.      Follow Up Recommendations Home health PT;Supervision for mobility/OOB    Equipment Recommendations  None recommended by PT;Other (comment) (reports having rw at home)    Recommendations for Other Services       Precautions / Restrictions Precautions Precautions: Back Precaution Comments: reviewed back precautions Required Braces or Orthoses: Spinal Brace Spinal Brace: Applied in sitting position Restrictions Weight Bearing Restrictions: No      Mobility  Bed Mobility Overal bed mobility: Needs Assistance Bed Mobility: Rolling;Sidelying to Sit Rolling: Supervision (rolling to rt, using rail) Sidelying to sit: Min assist (with verbal cues)       General bed mobility comments: cues throughout for logroll technique.  Transfers Overall transfer level: Needs assistance Equipment used: Rolling walker (2 wheeled) Transfers: Sit to/from Stand Sit to Stand: From elevated surface;Min assist         General transfer comment: cues for hand placement. Transferring from bed and commode.   Ambulation/Gait Ambulation/Gait assistance: Min guard Ambulation Distance (Feet): 140 Feet (125 feet X1, 15 feet X1) Assistive device: Rolling walker (2 wheeled) Gait  Pattern/deviations: Step-through pattern Gait velocity: decreased.    General Gait Details: even strides, 1 loss of balance with independent recovery.   Stairs            Wheelchair Mobility    Modified Rankin (Stroke Patients Only)       Balance Overall balance assessment: Needs assistance Sitting-balance support: No upper extremity supported Sitting balance-Leahy Scale: Good     Standing balance support: Bilateral upper extremity supported Standing balance-Leahy Scale: Poor Standing balance comment: using rw                             Pertinent Vitals/Pain Pain Assessment: 0-10 Pain Score: 5  Pain Location: back Pain Descriptors / Indicators: Sore Pain Intervention(s): Limited activity within patient's tolerance;Monitored during session    Home Living Family/patient expects to be discharged to:: Private residence Living Arrangements: Spouse/significant other Available Help at Discharge: Family Type of Home: House Home Access: Stairs to enter Entrance Stairs-Rails: None Entrance Stairs-Number of Steps: 2-3 Home Layout: Able to live on main level with bedroom/bathroom Home Equipment: Walker - 2 wheels      Prior Function Level of Independence: Independent with assistive device(s)         Comments: using rw prior to surgery.      Hand Dominance        Extremity/Trunk Assessment   Upper Extremity Assessment: Defer to OT evaluation           Lower Extremity Assessment: Generalized weakness (decreased strengh prior to surgery.)         Communication   Communication: No difficulties  Cognition Arousal/Alertness: Awake/alert Behavior During Therapy: WFL for tasks assessed/performed Overall Cognitive Status: Within Functional Limits for tasks assessed  General Comments General comments (skin integrity, edema, etc.): patient able to don brace in sitting with min cues.     Exercises         Assessment/Plan    PT Assessment Patient needs continued PT services  PT Diagnosis Difficulty walking;Generalized weakness;Acute pain   PT Problem List Decreased strength;Decreased activity tolerance;Decreased balance;Decreased mobility;Pain  PT Treatment Interventions DME instruction;Gait training;Stair training;Functional mobility training;Therapeutic activities;Therapeutic exercise;Balance training;Patient/family education   PT Goals (Current goals can be found in the Care Plan section) Acute Rehab PT Goals Patient Stated Goal: go home tomorrow PT Goal Formulation: With patient Time For Goal Achievement: 03/03/15 Potential to Achieve Goals: Good    Frequency Min 5X/week   Barriers to discharge        Co-evaluation               End of Session Equipment Utilized During Treatment: Gait belt;Back brace Activity Tolerance: Patient tolerated treatment well Patient left: in chair;with call bell/phone within reach;with family/visitor present Nurse Communication: Mobility status;Precautions         Time: 1610-9604 PT Time Calculation (min) (ACUTE ONLY): 46 min   Charges:   PT Evaluation $Initial PT Evaluation Tier I: 1 Procedure PT Treatments $Gait Training: 8-22 mins $Therapeutic Activity: 8-22 mins   PT G Codes:        Christiane Ha, PT, CSCS Pager [phone] Office [phone]  02/17/2015, 1:20 PM

## 2015-02-18 NOTE — Progress Notes (Signed)
Kristopher ShepherdJames Walsh  MRN: [id number] DOB/Age: [birth year] 59 y.o. Physician: Shon BatonBrooks Procedure: Procedure(s) (LRB): LUMBAR LAMINECTOMY/DECOMPRESSION MICRODISCECTOMY/L2-5 (N/A)     Subjective: Wants to go home. No leg pain. Voiding and has had BM  Vital Signs Temp:  [98.3 F (36.8 C)-99.1 F (37.3 C)] 98.3 F (36.8 C) (11/05 0916) Pulse Rate:  [84-97] 97 (11/05 0916) Resp:  [18-19] 19 (11/05 0916) BP: (106-129)/(70-89) 106/70 mmHg (11/05 0916) SpO2:  [95 %-97 %] 95 % (11/05 0916)  Lab Results  Recent Labs  02/16/15 1548  HGB 13.3  HCT 39.0   BMET  Recent Labs  02/16/15 1548  NA 134*  K 5.0  GLUCOSE 140*   No results found for: INR   Exam Drain dc'd without complication NVI        Plan DC home  Kristopher Grimm PA-C   02/18/2015, 1:16 PM Contact # [phone]

## 2015-02-18 NOTE — Progress Notes (Signed)
Physical Therapy Treatment Patient Details Name: Kristopher Walsh MRN: [id number] DOB: 1955-07-19 Today's Date: 02/18/2015    History of Present Illness 59 y.o. male s/p LUMBAR LAMINECTOMY/DECOMPRESSION MICRODISCECTOMY/L2-5. PMH: COPD      PT Comments    Pt moving well.  Completed stair training this session.  Pt very eager to d/c home.  Patient safe to D/C from a mobility standpoint based on progression towards goals set on PT eval.    Follow Up Recommendations  Home health PT;Supervision for mobility/OOB     Equipment Recommendations  None recommended by PT    Recommendations for Other Services       Precautions / Restrictions Precautions Precautions: Back Precaution Comments: reviewed all back precautions with pt. and spouse Required Braces or Orthoses: Spinal Brace Spinal Brace: Applied in sitting position Restrictions Weight Bearing Restrictions: No    Mobility  Bed Mobility Overal bed mobility: Needs Assistance Bed Mobility: Rolling;Sidelying to Sit;Sit to Sidelying Rolling: Supervision Sidelying to sit: Supervision     Sit to sidelying: Min assist General bed mobility comments: bed elevated to approximate height of bed at home.  hob flat, pt. enters/exists from right side.  min a to bring b les into bed, had wife assist to ensure proper log roll technique and body mechanics  Transfers Overall transfer level: Needs assistance Equipment used: Rolling walker (2 wheeled) Transfers: Sit to/from Stand Sit to Stand: Min guard Stand pivot transfers: Min guard       General transfer comment: cues for hand placement  Ambulation/Gait Ambulation/Gait assistance: Min guard Ambulation Distance (Feet): 200 Feet Assistive device: Rolling walker (2 wheeled) Gait Pattern/deviations: Step-through pattern Gait velocity: decreased.    General Gait Details: cues to relax shoulders   Stairs Stairs: Yes Stairs assistance: Min guard Stair Management: One rail  Right;Sideways Number of Stairs: 4 General stair comments: cues to reinforce back precautions  Wheelchair Mobility    Modified Rankin (Stroke Patients Only)       Balance                                    Cognition Arousal/Alertness: Awake/alert Behavior During Therapy: WFL for tasks assessed/performed Overall Cognitive Status: Within Functional Limits for tasks assessed                      Exercises      General Comments        Pertinent Vitals/Pain Pain Assessment: No/denies pain    Home Living                      Prior Function            PT Goals (current goals can now be found in the care plan section) Acute Rehab PT Goals PT Goal Formulation: With patient Time For Goal Achievement: 03/03/15 Potential to Achieve Goals: Good Progress towards PT goals: Progressing toward goals    Frequency  Min 5X/week    PT Plan Current plan remains appropriate    Co-evaluation             End of Session Equipment Utilized During Treatment: Back brace Activity Tolerance: Patient tolerated treatment well Patient left: in chair;with call bell/phone within reach;with family/visitor present     Time: [id number] PT Time Calculation (min) (ACUTE ONLY): 18 min  Charges:  $Gait Training: 8-22 mins  G Codes:      [name] 02/18/2015, 11:47 AM   [name], PTA [phone] 02/18/2015

## 2015-02-18 NOTE — Progress Notes (Signed)
CM spoke to [employer] Company[employer]acy Shuford with Ortho who states that only PT needed for Home health services, not RN. CM will update instructions and advise HH agency Tiffany with [facility].

## 2015-02-18 NOTE — Progress Notes (Addendum)
CM spoke to patient and family at the bedside who agreed to recommendation of 3N1. DME agency choice offered [facility]. [facility] who will deliver 3N1 to the room. Cm called T[facility] with [facility] to [facility]. Family denies other needs and contact information confirmed.

## 2015-02-18 NOTE — Discharge Instructions (Signed)
May shower on day 5

## 2015-02-18 NOTE — Progress Notes (Signed)
Occupational Therapy Treatment Patient Details Name: Kristopher ShepherdJames Walsh MRN: [id number] DOB: 1955-12-01 Today's Date: 02/18/2015    History of present illness 59 y.o. male s/p LUMBAR LAMINECTOMY/DECOMPRESSION MICRODISCECTOMY/L2-5. PMH: COPD     OT comments  Wife present for session this am.  Pt. And spouse able to return demo of bed mobility in/out, and toileting with emphasis on integration of back precautions during functional mobility and ADLS.  Will follow along, pt. Eager for and wanting d/c home later today.    Follow Up Recommendations  No OT follow up;Supervision - Intermittent    Equipment Recommendations  3 in 1 bedside comode    Recommendations for Other Services      Precautions / Restrictions Precautions Precautions: Back Precaution Comments: reviewed all back precautions with pt. and spouse Required Braces or Orthoses: Spinal Brace Spinal Brace: Applied in sitting position       Mobility Bed Mobility Overal bed mobility: Needs Assistance Bed Mobility: Rolling;Sidelying to Sit;Sit to Sidelying Rolling: Supervision Sidelying to sit: Supervision     Sit to sidelying: Min assist General bed mobility comments: bed elevated to approximate height of bed at home.  hob flat, pt. enters/exists from right side.  min a to bring b les into bed, had wife assist to ensure proper log roll technique and body mechanics  Transfers Overall transfer level: Needs assistance Equipment used: Rolling walker (2 wheeled) Transfers: Sit to/from UGI CorporationStand;Stand Pivot Transfers Sit to Stand: Min guard Stand pivot transfers: Min guard       General transfer comment: cues to push through b ues and not pull on walker, pt. had tendency to try to scoot closer and closer to edge of surface to power up.  educated on this being a fall risk. encouraged use of b ues vs scooting to edge    Balance                                   ADL Overall ADL's : Needs assistance/impaired      Grooming: Wash/dry hands;Standing;Supervision/safety         Lower Body Bathing Details (indicate cue type and reason): wife available 24/7 to assist as needed Upper Body Dressing : Set up;Sitting Upper Body Dressing Details (indicate cue type and reason): pt. able to don brace seated   Lower Body Dressing Details (indicate cue type and reason): wife available 24/7 to assist as needed Toilet Transfer: Supervision/safety;RW;Ambulation Toilet Transfer Details (indicate cue type and reason): pt. stood to urinate Toileting- ArchitectClothing Manipulation and Hygiene: Supervision/safety;Sit to/from stand   Tub/ Shower Transfer: Walk-in Development worker, communityshower;Rolling walker Tub/Shower Transfer Details (indicate cue type and reason): provided demo of shower stall transfer for pt. and spouse, pt. denies need for physical return demo "ive been through all this before, im going to be fine" Functional mobility during ADLs: Supervision/safety;Rolling walker General ADL Comments: wife present for session and requests demo of in/out of bed, pt. reluctant but agreeable.  cues for log rolling tech. had wife assist with b les for family ed.  reviewed shower stall transfer, pt. declined return demo.      Vision                     Perception     Praxis      Cognition   Behavior During Therapy: [facility] for tasks assessed/performed Overall Cognitive Status: Within Functional Limits for tasks assessed  Extremity/Trunk Assessment               Exercises     Shoulder Instructions       General Comments      Pertinent Vitals/ Pain       Pain Assessment: No/denies pain  Home Living                                          Prior Functioning/Environment              Frequency Min 2X/week     Progress Toward Goals  OT Goals(current goals can now be found in the care plan section)  Progress towards OT goals: Progressing toward goals     Plan  Discharge plan remains appropriate    Co-evaluation                 End of Session Equipment Utilized During Treatment: Gait belt;Rolling walker;Back brace   Activity Tolerance Patient tolerated treatment well   Patient Left in chair;with call bell/phone within reach;with family/visitor present   Nurse Communication          Time: 1610-9604 OT Time Calculation (min): 20 min  Charges: OT General Charges $OT Visit: 1 Procedure OT Treatments $Self Care/Home Management : 8-22 mins  Robet Leu, COTA/L 02/18/2015, 8:15 AM

## 2015-03-24 NOTE — Discharge Summary (Signed)
Physician Discharge Summary  Patient ID: Kristopher Walsh MRN: [id number] DOB/AGE: [birth year] 58 y.o.  Admit date: 02/15/2015 Discharge date: 03/24/2015  Admission Diagnoses:  Low Back Pain, Radiculopathy and weakness of RLE  Discharge Diagnoses:  Active Problems:   Lumbar disc disease with radiculopathy   Lumbar back pain with radiculopathy affecting right lower extremity   Back pain   Past Medical History  Diagnosis Date  . COPD (chronic obstructive pulmonary disease) (HCC)   . OSA on CPAP   . Chronic lower back pain   . Kidney stone   . Falls frequently   . Lower extremity weakness     "right side is the worse" (02/15/2015)    Surgeries: Procedure(s): LUMBAR LAMINECTOMY/DECOMPRESSION MICRODISCECTOMY/L2-5 on 02/15/2015 - 02/16/2015   Consultants (if any):    Discharged Condition: Improved  Hospital Course: Kristopher Walsh is an 59 y.o. male who was admitted 02/15/2015 with a diagnosis of LBP and radicular pain and weakness of the RLE and went to the operating room on 02/16/2015 and underwent the above named procedures.  Pt discharged home on 02/18/15.  He was given perioperative antibiotics:  Anti-infectives    Start     Dose/Rate Route Frequency Ordered Stop   02/16/15 2030  ceFAZolin (ANCEF) IVPB 1 g/50 mL premix     1 g 100 mL/hr over 30 Minutes Intravenous Every 8 hours 02/16/15 1829 02/17/15 0456   02/16/15 1530  vancomycin (VANCOCIN) powder  Status:  Discontinued       As needed 02/16/15 1531 02/16/15 1631    .  He was given sequential compression devices, early ambulation, and TED for DVT prophylaxis.  He benefited maximally from the hospital stay and there were no complications.    Recent vital signs:  Filed Vitals:   02/18/15 0553 02/18/15 0916  BP: 129/89 106/70  Pulse: 84 97  Temp: 99.1 F (37.3 C) 98.3 F (36.8 C)  Resp: 18 19    Recent laboratory studies:  Lab Results  Component Value Date   HGB 13.3 02/16/2015   No results found for:  WBC, PLT No results found for: INR Lab Results  Component Value Date   NA 134* 02/16/2015   K 5.0 02/16/2015   GLUCOSE 140* 02/16/2015    Discharge Medications:     Medication List    STOP taking these medications        DULERA 200-5 MCG/ACT Aero  Generic drug:  mometasone-formoterol     orphenadrine 100 MG tablet  Commonly known as:  NORFLEX     zolpidem 10 MG tablet  Commonly known as:  AMBIEN      TAKE these medications        ALPRAZolam 1 MG tablet  Commonly known as:  XANAX  Take 1.5 mg by mouth at bedtime as needed for anxiety.     fluticasone 50 MCG/ACT nasal spray  Commonly known as:  FLONASE  Place 2 sprays into both nostrils daily as needed for allergies or rhinitis.     methocarbamol 500 MG tablet  Commonly known as:  ROBAXIN  Take 1 tablet (500 mg total) by mouth 3 (three) times daily as needed for muscle spasms.     ondansetron 4 MG tablet  Commonly known as:  ZOFRAN  Take 1 tablet (4 mg total) by mouth every 8 (eight) hours as needed for nausea or vomiting.     oxyCODONE-acetaminophen 10-325 MG tablet  Commonly known as:  PERCOCET  Take 1 tablet by mouth every 4 (four) hours  as needed for pain.     polyethylene glycol powder powder  Commonly known as:  GLYCOLAX  Take 17 g by mouth daily.        Diagnostic Studies: No results found.  Disposition: 01-Home or Self Care        Follow-up Information    Follow up with Alvy Beal, MD In 2 weeks.   Specialty:  Orthopedic Surgery   Why:  For suture removal, If symptoms worsen, For wound re-check   Contact information:   [address] [place] [address] [phone]       Follow up with [facility].   Why:  Someone from [facility] will contact you concerning start date and time for therapy.   Contact information:   [address] [place] [address] [phone]        Signed: Kirt Boys 03/24/2015, 3:35  PM

## 2015-06-14 DIAGNOSIS — M159 Polyosteoarthritis, unspecified: Secondary | ICD-10-CM | POA: Insufficient documentation

## 2015-06-14 DIAGNOSIS — N400 Enlarged prostate without lower urinary tract symptoms: Secondary | ICD-10-CM | POA: Insufficient documentation

## 2015-06-14 DIAGNOSIS — M5136 Other intervertebral disc degeneration, lumbar region: Secondary | ICD-10-CM | POA: Insufficient documentation

## 2015-06-14 DIAGNOSIS — E782 Mixed hyperlipidemia: Secondary | ICD-10-CM | POA: Insufficient documentation

## 2015-06-14 DIAGNOSIS — G4730 Sleep apnea, unspecified: Secondary | ICD-10-CM | POA: Insufficient documentation

## 2015-06-14 DIAGNOSIS — I10 Essential (primary) hypertension: Secondary | ICD-10-CM | POA: Insufficient documentation

## 2015-06-14 DIAGNOSIS — F419 Anxiety disorder, unspecified: Secondary | ICD-10-CM | POA: Insufficient documentation

## 2015-06-16 DIAGNOSIS — J4489 Other specified chronic obstructive pulmonary disease: Secondary | ICD-10-CM | POA: Insufficient documentation

## 2015-10-04 ENCOUNTER — Other Ambulatory Visit: Payer: Self-pay | Admitting: Orthopedic Surgery

## 2015-10-04 DIAGNOSIS — M5442 Lumbago with sciatica, left side: Secondary | ICD-10-CM

## 2015-10-13 ENCOUNTER — Ambulatory Visit
Admission: RE | Admit: 2015-10-13 | Discharge: 2015-10-13 | Disposition: A | Discharge status: Home / Self Care | Payer: BLUE CROSS/BLUE SHIELD | Source: Ambulatory Visit | Attending: Orthopedic Surgery | Admitting: Orthopedic Surgery

## 2015-10-13 DIAGNOSIS — M5442 Lumbago with sciatica, left side: Secondary | ICD-10-CM

## 2015-10-13 MED ORDER — GADOBENATE DIMEGLUMINE 529 MG/ML IV SOLN
15.0000 mL | Freq: Once | INTRAVENOUS | Status: AC | PRN
  Administered 2015-10-13: 15 mL via INTRAVENOUS

## 2015-12-21 DIAGNOSIS — F5101 Primary insomnia: Secondary | ICD-10-CM | POA: Insufficient documentation

## 2016-06-19 DIAGNOSIS — S46912A Strain of unspecified muscle, fascia and tendon at shoulder and upper arm level, left arm, initial encounter: Secondary | ICD-10-CM | POA: Insufficient documentation

## 2016-06-19 DIAGNOSIS — Z9289 Personal history of other medical treatment: Secondary | ICD-10-CM | POA: Insufficient documentation

## 2016-07-28 IMAGING — CR DG LUMBAR SPINE 2-3V
2 series · 2 of 2 positions shown · non-contrast
Comparison: 02/15/2015 MR

CLINICAL DATA: Lumbar laminectomy, decompression and
microdiscectomy.

EXAM:
LUMBAR SPINE - 2-3 VIEW

[lateral (1 of 2)]
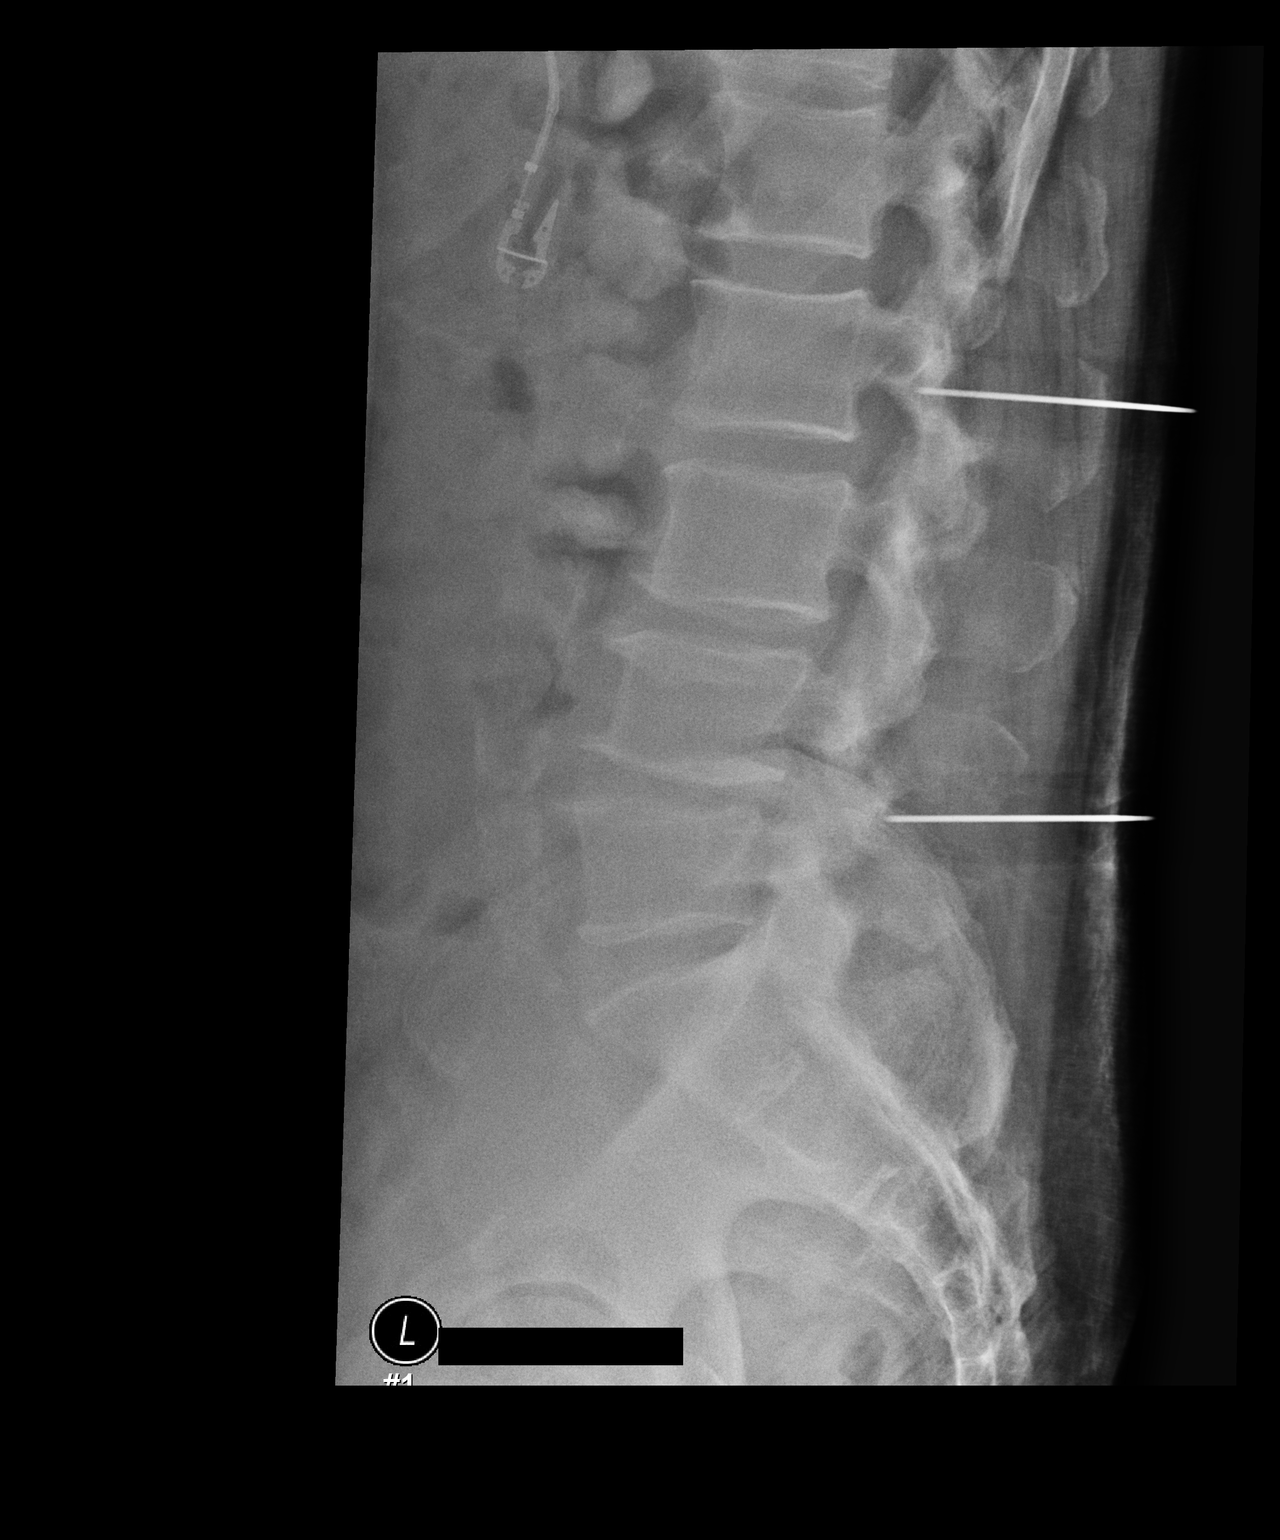

[lateral (2 of 2)]
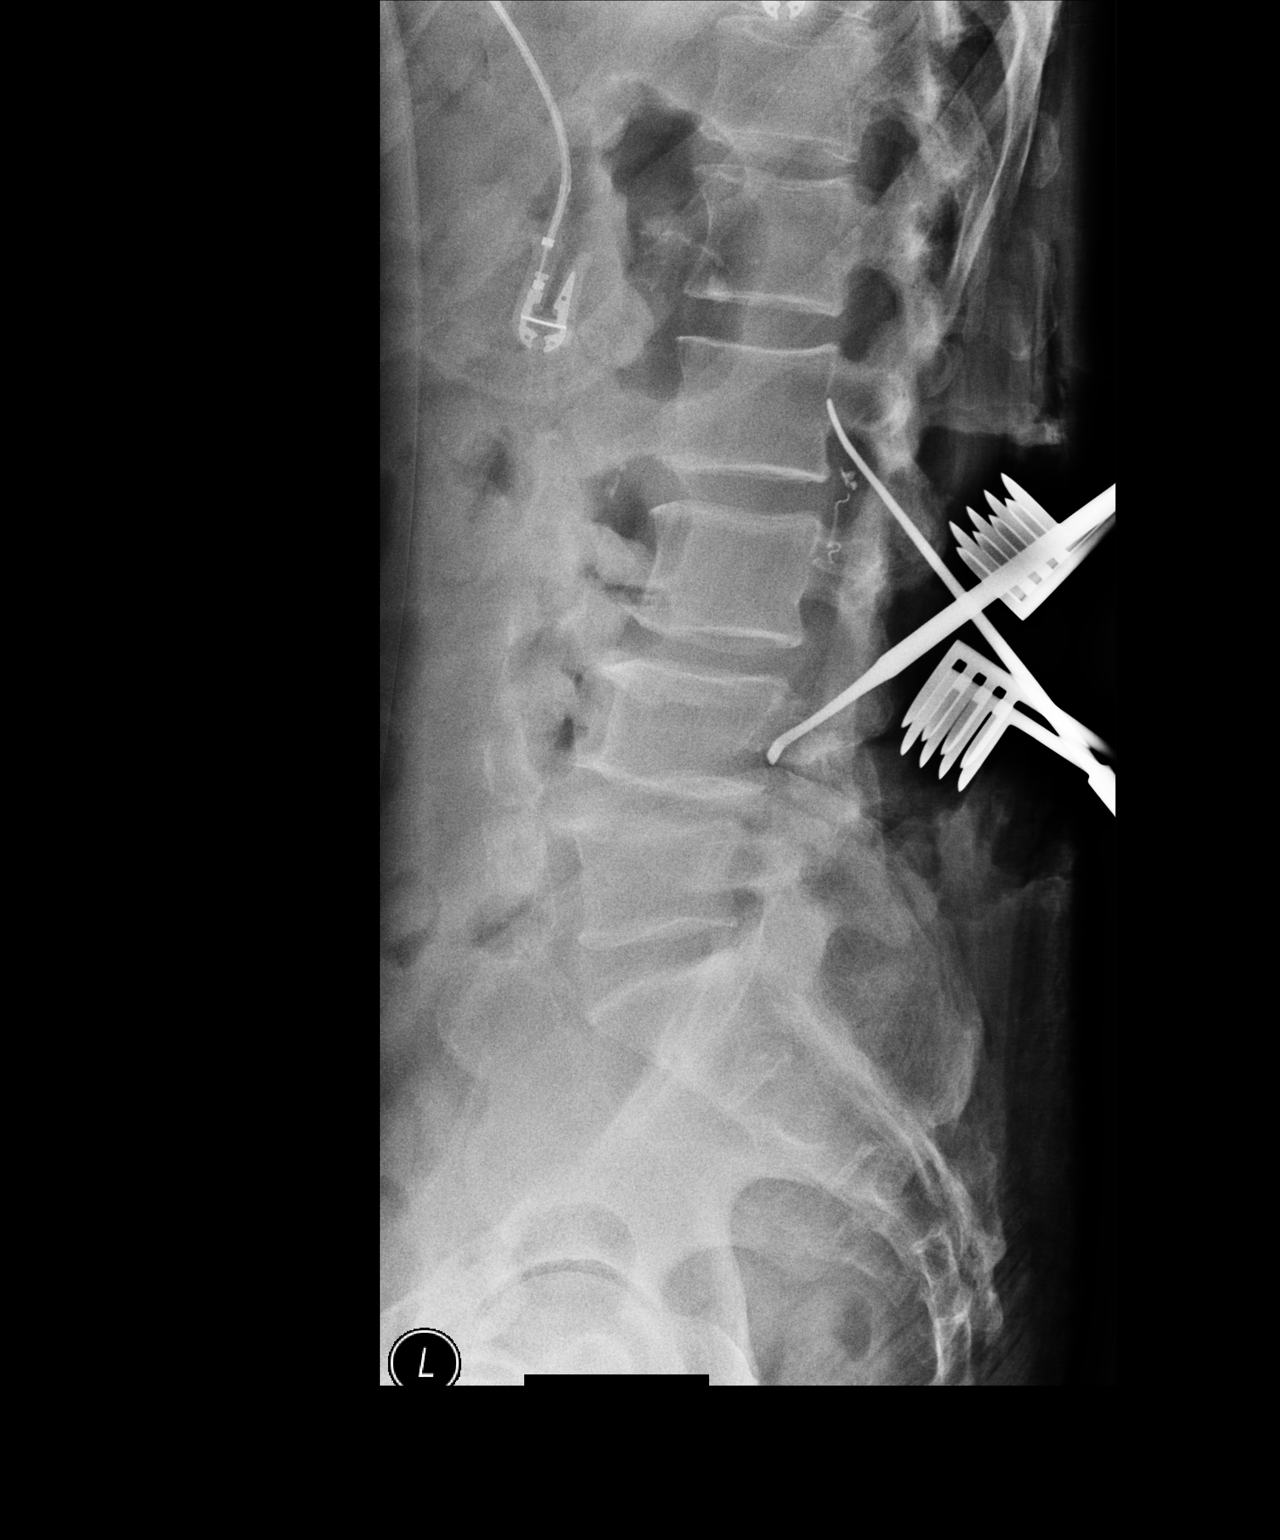

[2 of 2 positions shown; findings below may reference images not displayed]

FINDINGS: Two intraoperative lateral views of the lumbar spine are submitted
postoperatively for interpretation.

Film 1 demonstrates 2 posterior metallic probes, these superior
probe directed at the L2 vertebral body and the inferior probe
directed at the L4-5 interspace.

Film 2 demonstrates 2 posterior metallic probes with the superiorly
directed probe at the level of L2 and inferiorly directed probe at
the level of L4.
IMPRESSION: Localizers as described.

## 2016-07-28 IMAGING — CR DG SPINE 1V PORT
1 series · 1 of 1 positions shown · non-contrast
Comparison: Lumbar MRI 02/15/2015

CLINICAL DATA: Lumbar laminectomy decompression L2 through L5

EXAM:
PORTABLE SPINE - 1 VIEW

[lateral]
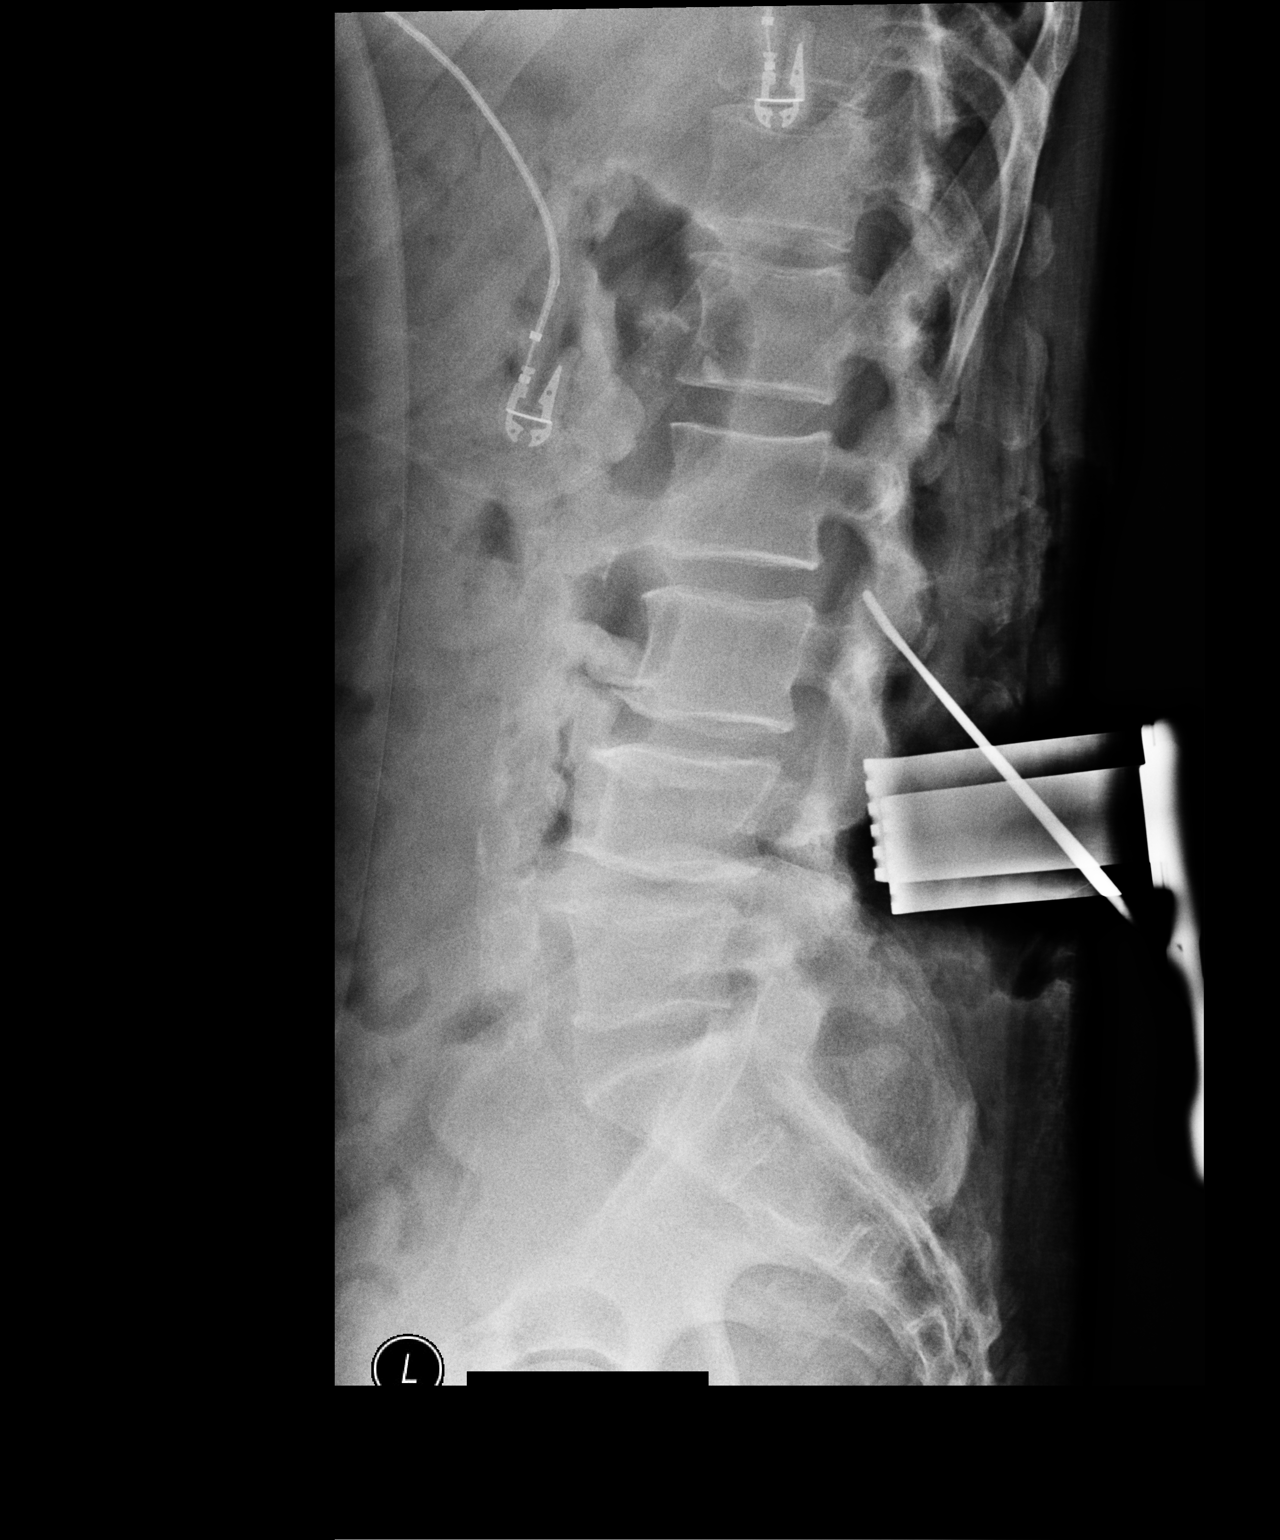

[1 of 1 positions shown; findings below may reference images not displayed]

FINDINGS: Image number 2 in the operating room

Tissue spreaders posterior to the L4 vertebral body.

Surgical instrument under the L2 lamina at the L2-3 disc space. Tip
of instrument is over the posterior canal at L2-3.
IMPRESSION: L2-3 localization.

## 2017-07-09 DIAGNOSIS — E6609 Other obesity due to excess calories: Secondary | ICD-10-CM | POA: Insufficient documentation

## 2020-07-11 DIAGNOSIS — N200 Calculus of kidney: Secondary | ICD-10-CM | POA: Insufficient documentation

## 2020-12-26 DIAGNOSIS — Z9049 Acquired absence of other specified parts of digestive tract: Secondary | ICD-10-CM | POA: Insufficient documentation

## 2021-01-22 DIAGNOSIS — Z96641 Presence of right artificial hip joint: Secondary | ICD-10-CM | POA: Insufficient documentation

## 2022-01-18 DIAGNOSIS — M545 Low back pain, unspecified: Secondary | ICD-10-CM | POA: Insufficient documentation

## 2022-01-21 ENCOUNTER — Other Ambulatory Visit: Payer: Self-pay | Admitting: Orthopedic Surgery

## 2022-01-21 DIAGNOSIS — M5459 Other low back pain: Secondary | ICD-10-CM

## 2022-02-02 ENCOUNTER — Ambulatory Visit
Admission: RE | Admit: 2022-02-02 | Discharge: 2022-02-02 | Disposition: A | Discharge status: Home / Self Care | Payer: Self-pay | Source: Ambulatory Visit | Attending: Orthopedic Surgery | Admitting: Orthopedic Surgery

## 2022-02-02 DIAGNOSIS — M5459 Other low back pain: Secondary | ICD-10-CM

## 2022-03-11 DIAGNOSIS — M25551 Pain in right hip: Secondary | ICD-10-CM | POA: Insufficient documentation

## 2022-03-26 ENCOUNTER — Other Ambulatory Visit: Payer: Self-pay | Admitting: *Deleted

## 2022-03-26 DIAGNOSIS — M79604 Pain in right leg: Secondary | ICD-10-CM

## 2022-03-29 DIAGNOSIS — M792 Neuralgia and neuritis, unspecified: Secondary | ICD-10-CM | POA: Insufficient documentation

## 2022-03-29 DIAGNOSIS — M47816 Spondylosis without myelopathy or radiculopathy, lumbar region: Secondary | ICD-10-CM | POA: Insufficient documentation

## 2022-04-03 ENCOUNTER — Ambulatory Visit: Payer: Medicare HMO | Admitting: Vascular Surgery

## 2022-04-03 ENCOUNTER — Encounter: Payer: Self-pay | Admitting: Vascular Surgery

## 2022-04-03 ENCOUNTER — Ambulatory Visit (HOSPITAL_COMMUNITY)
Admission: RE | Admit: 2022-04-03 | Discharge: 2022-04-03 | Disposition: A | Discharge status: Home / Self Care | Payer: Medicare HMO | Source: Ambulatory Visit | Attending: Vascular Surgery | Admitting: Vascular Surgery

## 2022-04-03 VITALS — BP 137/84 | HR 86 | Temp 97.9°F | Resp 20 | Ht 72.0 in | Wt 243.0 lb

## 2022-04-03 DIAGNOSIS — M79604 Pain in right leg: Secondary | ICD-10-CM | POA: Insufficient documentation

## 2022-04-03 DIAGNOSIS — I70211 Atherosclerosis of native arteries of extremities with intermittent claudication, right leg: Secondary | ICD-10-CM

## 2022-04-03 DIAGNOSIS — M79605 Pain in left leg: Secondary | ICD-10-CM | POA: Insufficient documentation

## 2022-04-03 NOTE — Progress Notes (Signed)
Patient ID: [name], male   DOB: [birth year], 66 y.o.   MRN: [id number]  Reason for Consult: New Patient (Initial Visit)   Referred by Kristopher Walsh., MD  Subjective:     HPI:  Kristopher Walsh is a 66 y.o. male without significant vascular history underwent right hip replacement a little over 1 year ago and has had persistent right thigh pain since that time.  While recovering he has attempted to walk but has calf cramping in approximately 200 yards.  He does not have any tissue loss or or ulceration.  He is a former smoker but quit 15 years ago.  He does not have other risk factors for vascular disease and denies any history of coronary artery disease.  Patiently currently not on any antiplatelet agents.  Past Medical History:  Diagnosis Date   Chronic lower back pain    COPD (chronic obstructive pulmonary disease) (HCC)    Falls frequently    Kidney stone    Lower extremity weakness    "right side is the worse" (02/15/2015)   OSA on CPAP    History reviewed. No pertinent family history. Past Surgical History:  Procedure Laterality Date   ABDOMINAL HERNIA REPAIR  2014   "below belly button"   APPENDECTOMY     HERNIA REPAIR     LITHOTRIPSY     LUMBAR DISC SURGERY  ~ 1980   LUMBAR LAMINECTOMY/DECOMPRESSION MICRODISCECTOMY N/A 02/16/2015   Procedure: LUMBAR LAMINECTOMY/DECOMPRESSION MICRODISCECTOMY/L2-5;  Surgeon: Kristopher Lick, MD;  Location: [facility] OR;  Service: Orthopedics;  Laterality: N/A;   TRANSURETHRAL RESECTION OF PROSTATE  2014    Short Social History:  Social History   Tobacco Use   Smoking status: Former    Packs/day: 1.50    Years: 35.00    Total pack years: 52.50    Types: Cigarettes   Smokeless tobacco: Never  Substance Use Topics   Alcohol use: No    Allergies  Allergen Reactions   Other Itching    cashews    Current Outpatient Medications  Medication Sig Dispense Refill   ALPRAZolam (XANAX) 1 MG tablet Take 1.5 mg by mouth at  bedtime as needed for anxiety.      celecoxib (CELEBREX) 200 MG capsule Take 200 mg by mouth daily.     fluticasone (FLONASE) 50 MCG/ACT nasal spray Place 2 sprays into both nostrils daily as needed for allergies or rhinitis.     Fluticasone Furoate-Vilanterol (BREO ELLIPTA IN) Inhale 1 puff into the lungs daily.     methocarbamol (ROBAXIN) 500 MG tablet Take 1 tablet (500 mg total) by mouth 3 (three) times daily as needed for muscle spasms. 60 tablet 0   No current facility-administered medications for this visit.    Review of Systems  Constitutional:  Constitutional negative. HENT: HENT negative.  Eyes: Eyes negative.  Respiratory: Respiratory negative.  Cardiovascular: Positive for claudication.  GI: Gastrointestinal negative.  Musculoskeletal: Positive for gait problem, leg pain and joint pain.  Hematologic: Hematologic/lymphatic negative.  Psychiatric: Psychiatric negative.        Objective:  Objective  Vitals:   04/03/22 1405  BP: 137/84  Pulse: 86  Resp: 20  Temp: 97.9 F (36.6 C)  SpO2: 95%    Physical Exam HENT:     Head: Normocephalic.     Nose: Nose normal.  Eyes:     Pupils: Pupils are equal, round, and reactive to light.  Cardiovascular:     Rate and Rhythm: Normal rate.  Pulses:          Femoral pulses are 1+ on the right side and 2+ on the left side.      Popliteal pulses are 0 on the right side and 2+ on the left side.       Dorsalis pedis pulses are 0 on the right side and 2+ on the left side.       Posterior tibial pulses are 0 on the right side and 2+ on the left side.  Abdominal:     General: Abdomen is flat.  Musculoskeletal:     Cervical back: Normal range of motion.     Right lower leg: No edema.     Left lower leg: No edema.  Skin:    Findings: No rash.  Neurological:     Mental Status: He is alert.     Data: ABI Findings:  +---------+------------------+-----+----------+--------+  Right   Rt Pressure (mmHg)IndexWaveform   Comment   +---------+------------------+-----+----------+--------+  Brachial 131                                        +---------+------------------+-----+----------+--------+  PTA     127               0.93 biphasic            +---------+------------------+-----+----------+--------+  DP      102               0.74 monophasic          +---------+------------------+-----+----------+--------+  Great Toe102               0.74                     +---------+------------------+-----+----------+--------+   +---------+------------------+-----+--------+-------+  Left    Lt Pressure (mmHg)IndexWaveformComment  +---------+------------------+-----+--------+-------+  Brachial 137                                     +---------+------------------+-----+--------+-------+  PTA     170               1.24 biphasic         +---------+------------------+-----+--------+-------+  DP      153               1.12 biphasic         +---------+------------------+-----+--------+-------+  Great Toe83                0.61                  +---------+------------------+-----+--------+-------+   +-------+-----------+-----------+------------+------------+  ABI/TBIToday's ABIToday's TBIPrevious ABIPrevious TBI  +-------+-----------+-----------+------------+------------+  Right 0.93       0.74                                 +-------+-----------+-----------+------------+------------+  Left  1.24       0.61                                 +-------+-----------+-----------+------------+------------+       Summary:  Right: Resting right ankle-brachial index is within normal range. The  right toe-brachial index is normal.   Left: Resting left ankle-brachial  index is within normal range. The left  toe-brachial index is abnormal.      Assessment/Plan:    66 year old male with right lower extremity claudication after right lower extremity hip  replacement which she has persistent pain radiating down his right thigh.  I discussed that he is not in any threat of limb loss at this time and that he should continue walking and initiate 81 mg of aspirin daily.  I will have him follow-up in 6 months with exercise ABIs.  Should he have worsened symptoms prior to that we can consider angiography from the left common femoral approach prior to 6 months.     Maeola Harman MD [facility]

## 2022-04-09 ENCOUNTER — Other Ambulatory Visit: Payer: Self-pay

## 2022-04-09 DIAGNOSIS — M79604 Pain in right leg: Secondary | ICD-10-CM

## 2022-04-09 DIAGNOSIS — I70211 Atherosclerosis of native arteries of extremities with intermittent claudication, right leg: Secondary | ICD-10-CM

## 2022-04-30 DIAGNOSIS — I739 Peripheral vascular disease, unspecified: Secondary | ICD-10-CM | POA: Insufficient documentation

## 2022-05-03 ENCOUNTER — Telehealth: Payer: Self-pay | Admitting: Gastroenterology

## 2022-05-03 NOTE — Telephone Encounter (Signed)
Hi Dr. Rush Landmark,   Patent called on his owe requesting a transfer of care from Dr. Melina Copa over to you specifically. Stated Dr. Melina Copa has retired, he did have a colonoscopy report sent to [place] for review from 2015. Stated he is currently not experiencing any GI issues.   Please advise on scheduling.  Thanks

## 2022-05-20 NOTE — Telephone Encounter (Signed)
Hello Dr Rush Landmark, patient is calling to follow up with the transfer of  care request.Thanks

## 2022-06-03 NOTE — Telephone Encounter (Signed)
Patient's wife is calling to check on transfer status, upon your response I have printed another copy of patient's records and put it on your desk. Please review at your earliest convenience and advise on scheduling. Thank You!

## 2022-06-03 NOTE — Telephone Encounter (Signed)
I will review by Wednesday when I return to clinic. Thanks. GM

## 2022-06-03 NOTE — Telephone Encounter (Signed)
The patient's chart reviewed  2015 colonoscopy Although the prep was adequate, copious amounts of liquid and particulate stool had to be aspirated with seeds present that repeatedly clogged up the endoscope. A single sessile 5 mm polyp of benign appearance was found in the cecum.  Polypectomy performed with hot snare.  A single sessile 3 mm polyp of benign appearance was found in the rectum.  Single piece polypectomy with a cold snare was performed. Pathology consistent with tubular adenoma as well as mucosal prolapse/trauma 5-year colonoscopy was recommended.  The patient is overdue for colonoscopy and I am happy to accept his care of transition.  He can be scheduled for colonoscopy next available with me in the [facility].   Justice Britain, MD [facility] Office # [phone]

## 2022-06-04 ENCOUNTER — Encounter: Payer: Self-pay | Admitting: Gastroenterology

## 2022-06-04 NOTE — Telephone Encounter (Signed)
Patient scheduled in [place] 5/7 at 10:30

## 2022-07-29 ENCOUNTER — Encounter: Payer: Self-pay | Admitting: Gastroenterology

## 2022-07-29 ENCOUNTER — Ambulatory Visit (AMBULATORY_SURGERY_CENTER): Payer: Medicare HMO

## 2022-07-29 VITALS — Ht 72.0 in | Wt 238.0 lb

## 2022-07-29 DIAGNOSIS — Z86010 Personal history of colonic polyps: Secondary | ICD-10-CM

## 2022-07-29 MED ORDER — PEG 3350-KCL-NA BICARB-NACL 420 G PO SOLR: 4000.0000 mL | Freq: Once | ORAL | 0 refills | Status: AC

## 2022-07-29 NOTE — Progress Notes (Signed)
No egg or soy allergy known to patient  No issues known to pt with past sedation with any surgeries or procedures Patient denies ever being told they had issues or difficulty with intubation  No FH of Malignant Hyperthermia Pt is not on diet pills Pt is not on  home 02  Pt is not on blood thinners  Pt denies issues with constipation  No A fib or A flutter Have any cardiac testing pending--NO  Pt instructed to use Single[employer].com or GoodRx for a price reduction on prep   Patient's chart reviewed by Kristopher Walsh CNRA prior to previsit and patient appropriate for the [facility].  Previsit completed and red dot placed by patient's name on their procedure day (on provider's schedule).    

## 2022-08-20 ENCOUNTER — Encounter: Payer: Self-pay | Admitting: Gastroenterology

## 2022-08-20 ENCOUNTER — Ambulatory Visit (AMBULATORY_SURGERY_CENTER): Payer: Medicare HMO | Admitting: Gastroenterology

## 2022-08-20 VITALS — BP 132/87 | HR 80 | Temp 97.5°F | Resp 17 | Ht 72.0 in | Wt 239.0 lb

## 2022-08-20 DIAGNOSIS — Z86010 Personal history of colonic polyps: Secondary | ICD-10-CM

## 2022-08-20 DIAGNOSIS — Z09 Encounter for follow-up examination after completed treatment for conditions other than malignant neoplasm: Secondary | ICD-10-CM

## 2022-08-20 DIAGNOSIS — D123 Benign neoplasm of transverse colon: Secondary | ICD-10-CM

## 2022-08-20 DIAGNOSIS — D125 Benign neoplasm of sigmoid colon: Secondary | ICD-10-CM | POA: Diagnosis not present

## 2022-08-20 DIAGNOSIS — D120 Benign neoplasm of cecum: Secondary | ICD-10-CM

## 2022-08-20 DIAGNOSIS — D124 Benign neoplasm of descending colon: Secondary | ICD-10-CM | POA: Diagnosis not present

## 2022-08-20 MED ORDER — SODIUM CHLORIDE 0.9 % IV SOLN: 500.0000 mL | Freq: Once | INTRAVENOUS | Status: DC

## 2022-08-20 NOTE — Progress Notes (Signed)
Vssmnad trans to pacu 

## 2022-08-20 NOTE — Progress Notes (Signed)
Called to room to assist during endoscopic procedure.  Patient ID and intended procedure confirmed with present staff. Received instructions for my participation in the procedure from the performing physician.  

## 2022-08-20 NOTE — Op Note (Signed)
[facility] Patient Name: Kristopher Walsh Procedure Date: 08/20/2022 10:25 AM MRN: [id number] Endoscopist: Corliss Parish , MD, [id number] Age: 67 Referring MD:  Date of Birth: 02/20/1956 Gender: Male Account #: [id number] Procedure:                Colonoscopy Indications:              Surveillance: Personal history of adenomatous                            polyps on last colonoscopy > 5 years ago Medicines:                Monitored Anesthesia Care Procedure:                Pre-Anesthesia Assessment:                           - Prior to the procedure, a History and Physical                            was performed, and patient medications and                            allergies were reviewed. The patient's tolerance of                            previous anesthesia was also reviewed. The risks                            and benefits of the procedure and the sedation                            options and risks were discussed with the patient.                            All questions were answered, and informed consent                            was obtained. Prior Anticoagulants: The patient has                            taken no anticoagulant or antiplatelet agents                            except for aspirin. ASA Grade Assessment: III - A                            patient with severe systemic disease. After                            reviewing the risks and benefits, the patient was                            deemed in satisfactory condition to undergo the  procedure.                           After obtaining informed consent, the colonoscope                            was passed under direct vision. Throughout the                            procedure, the patient's blood pressure, pulse, and                            oxygen saturations were monitored continuously. The                            Olympus SN [id number] was introduced  through the anus                            and advanced to the 3 cm into the ileum. The                            colonoscopy was performed without difficulty. The                            patient tolerated the procedure. The quality of the                            bowel preparation was adequate. The terminal ileum,                            ileocecal valve, appendiceal orifice, and rectum                            were photographed. Scope In: 10:31:44 AM Scope Out: 10:55:42 AM Scope Withdrawal Time: 0 hours 19 minutes 21 seconds  Total Procedure Duration: 0 hours 23 minutes 58 seconds  Findings:                 The digital rectal exam findings include                            hemorrhoids. Pertinent negatives include no                            palpable rectal lesions.                           The terminal ileum and ileocecal valve appeared                            normal.                           Eight sessile polyps were found in the sigmoid  colon, descending colon, transverse colon, hepatic                            flexure and cecum. The polyps were 2 to 6 mm in                            size. These polyps were removed with a cold snare.                            Resection and retrieval were complete.                           Multiple small-mouthed diverticula were found in                            the recto-sigmoid colon and sigmoid colon.                           Normal mucosa was found in the entire colon                            otherwise.                           Non-bleeding non-thrombosed external and internal                            hemorrhoids were found during retroflexion, during                            perianal exam and during digital exam. The                            hemorrhoids were Grade II (internal hemorrhoids                            that prolapse but reduce spontaneously). Complications:            No  immediate complications. Estimated Blood Loss:     Estimated blood loss was minimal. Impression:               - Hemorrhoids found on digital rectal exam.                           - The examined portion of the ileum was normal.                           - Eight, 2 to 6 mm polyps in the sigmoid colon, in                            the descending colon, in the transverse colon, at                            the hepatic flexure and in the cecum, removed with  a cold snare. Resected and retrieved.                           - Diverticulosis in the recto-sigmoid colon and in                            the sigmoid colon.                           - Normal mucosa in the entire examined colon                            otherwise.                           - Non-bleeding non-thrombosed internal hemorrhoids. Recommendation:           - The patient will be observed post-procedure,                            until all discharge criteria are met.                           - Discharge patient to home.                           - Patient has a contact number available for                            emergencies. The signs and symptoms of potential                            delayed complications were discussed with the                            patient. Return to normal activities tomorrow.                            Written discharge instructions were provided to the                            patient.                           - High fiber diet.                           - Use FiberCon 1-2 tablets PO daily.                           - Continue present medications.                           - Await pathology results.                           - Repeat colonoscopy in 3 years for surveillance.                           -  The findings and recommendations were discussed                            with the patient.                           - The findings and recommendations were  discussed                            with the patient's family. Corliss Parish, MD 08/20/2022 11:01:45 AM

## 2022-08-20 NOTE — Progress Notes (Signed)
GASTROENTEROLOGY PROCEDURE H&P NOTE   Primary Care Physician: Gordan Payment., MD  HPI: Kristopher Walsh is a 67 y.o. male who presents for Colonoscopy for surveillance of prior adenomas.  Past Medical History:  Diagnosis Date   Chronic lower back pain    COPD (chronic obstructive pulmonary disease) (HCC)    Falls frequently    Hypertension    Kidney stone    Lower extremity weakness    "right side is the worse" (02/15/2015)   OSA on CPAP    Sleep apnea    Past Surgical History:  Procedure Laterality Date   ABDOMINAL HERNIA REPAIR  04/15/2012   "below belly button"   APPENDECTOMY     COLONOSCOPY  2015   HERNIA REPAIR     LITHOTRIPSY     LUMBAR DISC SURGERY  ~ 1980   LUMBAR LAMINECTOMY/DECOMPRESSION MICRODISCECTOMY N/A 02/16/2015   Procedure: LUMBAR LAMINECTOMY/DECOMPRESSION MICRODISCECTOMY/L2-5;  Surgeon: Venita Lick, MD;  Location: [facility] OR;  Service: Orthopedics;  Laterality: N/A;   TRANSURETHRAL RESECTION OF PROSTATE  04/15/2012   Current Outpatient Medications  Medication Sig Dispense Refill   aspirin EC 81 MG tablet Take 81 mg by mouth daily. Swallow whole.     gabapentin (NEURONTIN) 300 MG capsule Take 1 capsule by mouth 3 (three) times daily.     losartan (COZAAR) 25 MG tablet Take 25 mg by mouth 2 (two) times daily.     nicotine polacrilex (NICORETTE) 2 MG gum Take 2 mg by mouth as needed for smoking cessation.     zolpidem (AMBIEN) 10 MG tablet Take 10 mg by mouth once.     ALPRAZolam (XANAX) 1 MG tablet Take 1.5 mg by mouth at bedtime as needed for anxiety.  (Patient not taking: Reported on 07/29/2022)     celecoxib (CELEBREX) 200 MG capsule Take 200 mg by mouth daily. (Patient not taking: Reported on 07/29/2022)     fluticasone (FLONASE) 50 MCG/ACT nasal spray Place 2 sprays into both nostrils daily as needed for allergies or rhinitis.     Fluticasone-Umeclidin-Vilant (TRELEGY ELLIPTA) 100-62.5-25 MCG/ACT AEPB Inhale 1 puff into the lungs daily. (Patient not  taking: Reported on 08/20/2022)     meloxicam (MOBIC) 7.5 MG tablet Take 7.5 mg by mouth daily.     methocarbamol (ROBAXIN) 500 MG tablet Take 1 tablet (500 mg total) by mouth 3 (three) times daily as needed for muscle spasms. 60 tablet 0   Turmeric 500 MG CAPS Take 2 tablets by mouth 3 (three) times daily.     Current Facility-Administered Medications  Medication Dose Route Frequency Provider Last Rate Last Admin   0.9 %  sodium chloride infusion  500 mL Intravenous Once Mansouraty, Netty Starring., MD        Current Outpatient Medications:    aspirin EC 81 MG tablet, Take 81 mg by mouth daily. Swallow whole., Disp: , Rfl:    gabapentin (NEURONTIN) 300 MG capsule, Take 1 capsule by mouth 3 (three) times daily., Disp: , Rfl:    losartan (COZAAR) 25 MG tablet, Take 25 mg by mouth 2 (two) times daily., Disp: , Rfl:    nicotine polacrilex (NICORETTE) 2 MG gum, Take 2 mg by mouth as needed for smoking cessation., Disp: , Rfl:    zolpidem (AMBIEN) 10 MG tablet, Take 10 mg by mouth once., Disp: , Rfl:    ALPRAZolam (XANAX) 1 MG tablet, Take 1.5 mg by mouth at bedtime as needed for anxiety.  (Patient not taking: Reported on 07/29/2022), Disp: ,  Rfl:    celecoxib (CELEBREX) 200 MG capsule, Take 200 mg by mouth daily. (Patient not taking: Reported on 07/29/2022), Disp: , Rfl:    fluticasone (FLONASE) 50 MCG/ACT nasal spray, Place 2 sprays into both nostrils daily as needed for allergies or rhinitis., Disp: , Rfl:    Fluticasone-Umeclidin-Vilant (TRELEGY ELLIPTA) 100-62.5-25 MCG/ACT AEPB, Inhale 1 puff into the lungs daily. (Patient not taking: Reported on 08/20/2022), Disp: , Rfl:    meloxicam (MOBIC) 7.5 MG tablet, Take 7.5 mg by mouth daily., Disp: , Rfl:    methocarbamol (ROBAXIN) 500 MG tablet, Take 1 tablet (500 mg total) by mouth 3 (three) times daily as needed for muscle spasms., Disp: 60 tablet, Rfl: 0   Turmeric 500 MG CAPS, Take 2 tablets by mouth 3 (three) times daily., Disp: , Rfl:   Current  Facility-Administered Medications:    0.9 %  sodium chloride infusion, 500 mL, Intravenous, Once, Mansouraty, Netty Starring., MD Allergies  Allergen Reactions   Other Itching    cashews   Family History  Problem Relation Age of Onset   Colon cancer Neg Hx    Colon polyps Neg Hx    Rectal cancer Neg Hx    Stomach cancer Neg Hx    Esophageal cancer Neg Hx    Social History   Socioeconomic History   Marital status: Married    Spouse name: Not on file   Number of children: Not on file   Years of education: Not on file   Highest education level: Not on file  Occupational History   Not on file  Tobacco Use   Smoking status: Former    Packs/day: 1.50    Years: 35.00    Additional pack years: 0.00    Total pack years: 52.50    Types: Cigarettes   Smokeless tobacco: Never  Vaping Use   Vaping Use: Never used  Substance and Sexual Activity   Alcohol use: No   Drug use: Yes    Comment: "used reconcreational drugs as a teenager"   Sexual activity: Not Currently  Other Topics Concern   Not on file  Social History Narrative   Not on file   Social Determinants of Health   Financial Resource Strain: Not on file  Food Insecurity: Not on file  Transportation Needs: Not on file  Physical Activity: Not on file  Stress: Not on file  Social Connections: Not on file  Intimate Partner Violence: Not on file    Physical Exam: Today's Vitals   08/20/22 0952 08/20/22 1004  BP: 134/77   Pulse: 78   Temp:  (!) 97.5 F (36.4 C)  SpO2: 96%   Weight: 239 lb (108.4 kg)   Height: 6' (1.829 m)    Body mass index is 32.41 kg/m. GEN: NAD EYE: Sclerae anicteric ENT: MMM CV: Non-tachycardic GI: Soft, NT/ND NEURO:  Alert & Oriented x 3  Lab Results: No results for input(s): "WBC", "HGB", "HCT", "PLT" in the last 72 hours. BMET No results for input(s): "NA", "K", "CL", "CO2", "GLUCOSE", "BUN", "CREATININE", "CALCIUM" in the last 72 hours. LFT No results for input(s): "PROT",  "ALBUMIN", "AST", "ALT", "ALKPHOS", "BILITOT", "BILIDIR", "IBILI" in the last 72 hours. PT/INR No results for input(s): "LABPROT", "INR" in the last 72 hours.   Impression / Plan: This is a 67 y.o.male who presents for Colonoscopy for surveillance of prior adenomas.  The risks and benefits of endoscopic evaluation/treatment were discussed with the patient and/or family; these include but are not limited  to the risk of perforation, infection, bleeding, missed lesions, lack of diagnosis, severe illness requiring hospitalization, as well as anesthesia and sedation related illnesses.  The patient's history has been reviewed, patient examined, no change in status, and deemed stable for procedure.  The patient and/or family is agreeable to proceed.    Justice Britain, MD [facility] Office # [phone]

## 2022-08-20 NOTE — Patient Instructions (Signed)
Resume previous diet (High Fiber) and medications. Awaiting pathology results. Repeat Colonoscopy date to be determined based on pathology. Handouts provided on: Colon polyps, Diverticulosis and Hemorrhoids  YOU HAD AN ENDOSCOPIC PROCEDURE TODAY AT THE [facility]:   Refer to the procedure report that was given to you for any specific questions about what was found during the examination.  If the procedure report does not answer your questions, please call your gastroenterologist to clarify.  If you requested that your care partner not be given the details of your procedure findings, then the procedure report has been included in a sealed envelope for you to review at your convenience later.  YOU SHOULD EXPECT: Some feelings of bloating in the abdomen. Passage of more gas than usual.  Walking can help get rid of the air that was put into your GI tract during the procedure and reduce the bloating. If you had a lower endoscopy (such as a colonoscopy or flexible sigmoidoscopy) you may notice spotting of blood in your stool or on the toilet paper. If you underwent a bowel prep for your procedure, you may not have a normal bowel movement for a few days.  Please Note:  You might notice some irritation and congestion in your nose or some drainage.  This is from the oxygen used during your procedure.  There is no need for concern and it should clear up in a day or so.  SYMPTOMS TO REPORT IMMEDIATELY:  Following lower endoscopy (colonoscopy or flexible sigmoidoscopy):  Excessive amounts of blood in the stool  Significant tenderness or worsening of abdominal pains  Swelling of the abdomen that is new, acute  Fever of 100F or higher  For urgent or emergent issues, a gastroenterologist can be reached at any hour by calling [phone]. Do not use MyChart messaging for urgent concerns.    DIET:  We do recommend a small meal at first, but then you may proceed to your regular diet.  Drink  plenty of fluids but you should avoid alcoholic beverages for 24 hours.  ACTIVITY:  You should plan to take it easy for the rest of today and you should NOT DRIVE or use heavy machinery until tomorrow (because of the sedation medicines used during the test).    FOLLOW UP: Our staff will call the number listed on your records the next business day following your procedure.  We will call around 7:15- 8:00 am to check on you and address any questions or concerns that you may have regarding the information given to you following your procedure. If we do not reach you, we will leave a message.     If any biopsies were taken you will be contacted by phone or by letter within the next 1-3 weeks.  Please call us at [phone] if you have not heard about the biopsies in 3 weeks.    SIGNATURES/CONFIDENTIALITY: You and/or your care partner have signed paperwork which will be entered into your electronic medical record.  These signatures attest to the fact that that the information above on your After Visit Summary has been reviewed and is understood.  Full responsibility of the confidentiality of this discharge information lies with you and/or your care-partner.

## 2022-08-20 NOTE — Progress Notes (Signed)
Pt's states no medical or surgical changes since previsit or office visit. 

## 2022-08-21 ENCOUNTER — Telehealth: Payer: Self-pay | Admitting: *Deleted

## 2022-08-21 NOTE — Telephone Encounter (Signed)
  Follow up Call-     08/20/2022   10:04 AM  Call back number  Post procedure Call Back phone  # [phone]  Permission to leave phone message Yes     Patient questions:  Do you have a fever, pain , or abdominal swelling? No. Pain Score  0 *  Have you tolerated food without any problems? Yes.    Have you been able to return to your normal activities? Yes.    Do you have any questions about your discharge instructions: Diet   No. Medications  No. Follow up visit  No.  Do you have questions or concerns about your Care? No.  Actions: * If pain score is 4 or above: No action needed, pain <4.

## 2022-08-23 ENCOUNTER — Encounter: Payer: Self-pay | Admitting: Gastroenterology

## 2023-01-16 ENCOUNTER — Other Ambulatory Visit: Payer: Self-pay | Admitting: Neurosurgery

## 2023-01-16 DIAGNOSIS — Z96641 Presence of right artificial hip joint: Secondary | ICD-10-CM

## 2023-01-16 DIAGNOSIS — M6258 Muscle wasting and atrophy, not elsewhere classified, other site: Secondary | ICD-10-CM

## 2023-02-09 ENCOUNTER — Ambulatory Visit
Admission: RE | Admit: 2023-02-09 | Discharge: 2023-02-09 | Disposition: A | Discharge status: Home / Self Care | Payer: Medicare HMO | Source: Ambulatory Visit | Attending: Neurosurgery | Admitting: Neurosurgery

## 2023-02-09 DIAGNOSIS — M6258 Muscle wasting and atrophy, not elsewhere classified, other site: Secondary | ICD-10-CM

## 2023-02-09 DIAGNOSIS — Z96641 Presence of right artificial hip joint: Secondary | ICD-10-CM

## 2023-03-27 ENCOUNTER — Other Ambulatory Visit: Payer: Self-pay | Admitting: Nurse Practitioner

## 2023-03-27 DIAGNOSIS — M5416 Radiculopathy, lumbar region: Secondary | ICD-10-CM

## 2023-04-02 ENCOUNTER — Other Ambulatory Visit (HOSPITAL_COMMUNITY): Payer: Self-pay | Admitting: Orthopedic Surgery

## 2023-04-02 DIAGNOSIS — M25551 Pain in right hip: Secondary | ICD-10-CM

## 2023-04-04 ENCOUNTER — Encounter: Payer: Self-pay | Admitting: Nurse Practitioner

## 2023-04-08 ENCOUNTER — Ambulatory Visit (HOSPITAL_COMMUNITY)
Admission: RE | Admit: 2023-04-08 | Discharge: 2023-04-08 | Disposition: A | Discharge status: Home / Self Care | Payer: Medicare HMO | Source: Ambulatory Visit | Attending: Orthopedic Surgery | Admitting: Orthopedic Surgery

## 2023-04-08 ENCOUNTER — Encounter (HOSPITAL_COMMUNITY)
Admission: RE | Admit: 2023-04-08 | Discharge: 2023-04-08 | Disposition: A | Discharge status: Home / Self Care | Payer: Medicare HMO | Source: Ambulatory Visit | Attending: Orthopedic Surgery | Admitting: Orthopedic Surgery

## 2023-04-08 DIAGNOSIS — M25551 Pain in right hip: Secondary | ICD-10-CM

## 2023-04-08 MED ORDER — TECHNETIUM TC 99M MEDRONATE IV KIT
22.0000 | PACK | Freq: Once | INTRAVENOUS | Status: AC | PRN
  Administered 2023-04-08: 22 via INTRAVENOUS

## 2023-04-12 ENCOUNTER — Ambulatory Visit
Admission: RE | Admit: 2023-04-12 | Discharge: 2023-04-12 | Disposition: A | Discharge status: Home / Self Care | Payer: Medicare HMO | Source: Ambulatory Visit | Attending: Nurse Practitioner | Admitting: Nurse Practitioner

## 2023-04-12 DIAGNOSIS — M5416 Radiculopathy, lumbar region: Secondary | ICD-10-CM
